# Patient Record
Sex: Male | Born: 1935 | Race: White | Hispanic: No | Marital: Married | Smoking: Former smoker
Health system: Southern US, Community
[De-identification: ages and names within clinical notes are randomized; demographics above are authoritative.]

## PROBLEM LIST (undated history)

## (undated) DIAGNOSIS — J8410 Pulmonary fibrosis, unspecified: Secondary | ICD-10-CM

## (undated) DIAGNOSIS — E785 Hyperlipidemia, unspecified: Secondary | ICD-10-CM

## (undated) DIAGNOSIS — I10 Essential (primary) hypertension: Secondary | ICD-10-CM

## (undated) DIAGNOSIS — I2510 Atherosclerotic heart disease of native coronary artery without angina pectoris: Secondary | ICD-10-CM

## (undated) DIAGNOSIS — I509 Heart failure, unspecified: Secondary | ICD-10-CM

## (undated) DIAGNOSIS — I4510 Unspecified right bundle-branch block: Secondary | ICD-10-CM

## (undated) DIAGNOSIS — I779 Disorder of arteries and arterioles, unspecified: Secondary | ICD-10-CM

## (undated) DIAGNOSIS — I340 Nonrheumatic mitral (valve) insufficiency: Secondary | ICD-10-CM

## (undated) DIAGNOSIS — I739 Peripheral vascular disease, unspecified: Secondary | ICD-10-CM

## (undated) HISTORY — DX: Atherosclerotic heart disease of native coronary artery without angina pectoris: I25.10

## (undated) HISTORY — DX: Essential (primary) hypertension: I10

## (undated) HISTORY — DX: Hyperlipidemia, unspecified: E78.5

## (undated) HISTORY — PX: OTHER SURGICAL HISTORY: SHX169

---

## 1991-03-17 HISTORY — PX: BACK SURGERY: SHX140

## 1997-11-22 ENCOUNTER — Other Ambulatory Visit: Admission: RE | Admit: 1997-11-22 | Discharge: 1997-11-22 | Payer: Self-pay | Admitting: Gastroenterology

## 1997-12-24 ENCOUNTER — Ambulatory Visit (HOSPITAL_COMMUNITY): Admission: RE | Admit: 1997-12-24 | Discharge: 1997-12-24 | Payer: Self-pay | Admitting: Gastroenterology

## 2003-06-15 HISTORY — PX: CORONARY ARTERY BYPASS GRAFT: SHX141

## 2003-07-07 ENCOUNTER — Inpatient Hospital Stay (HOSPITAL_COMMUNITY): Admission: EM | Admit: 2003-07-07 | Discharge: 2003-07-18 | Payer: Self-pay | Admitting: Emergency Medicine

## 2003-08-10 ENCOUNTER — Encounter: Admission: RE | Admit: 2003-08-10 | Discharge: 2003-08-10 | Payer: Self-pay | Admitting: Cardiothoracic Surgery

## 2003-08-24 ENCOUNTER — Encounter: Admission: RE | Admit: 2003-08-24 | Discharge: 2003-08-24 | Payer: Self-pay | Admitting: Cardiothoracic Surgery

## 2004-08-12 IMAGING — CR DG CHEST 2V
2 series · 2 of 2 positions shown · non-contrast
Comparison: 08/10/03.

CLINICAL DATA: Post mitral valve replacement for mitral regurgitation.
 TWO VIEW CHEST

[view not recorded (1 of 2)]
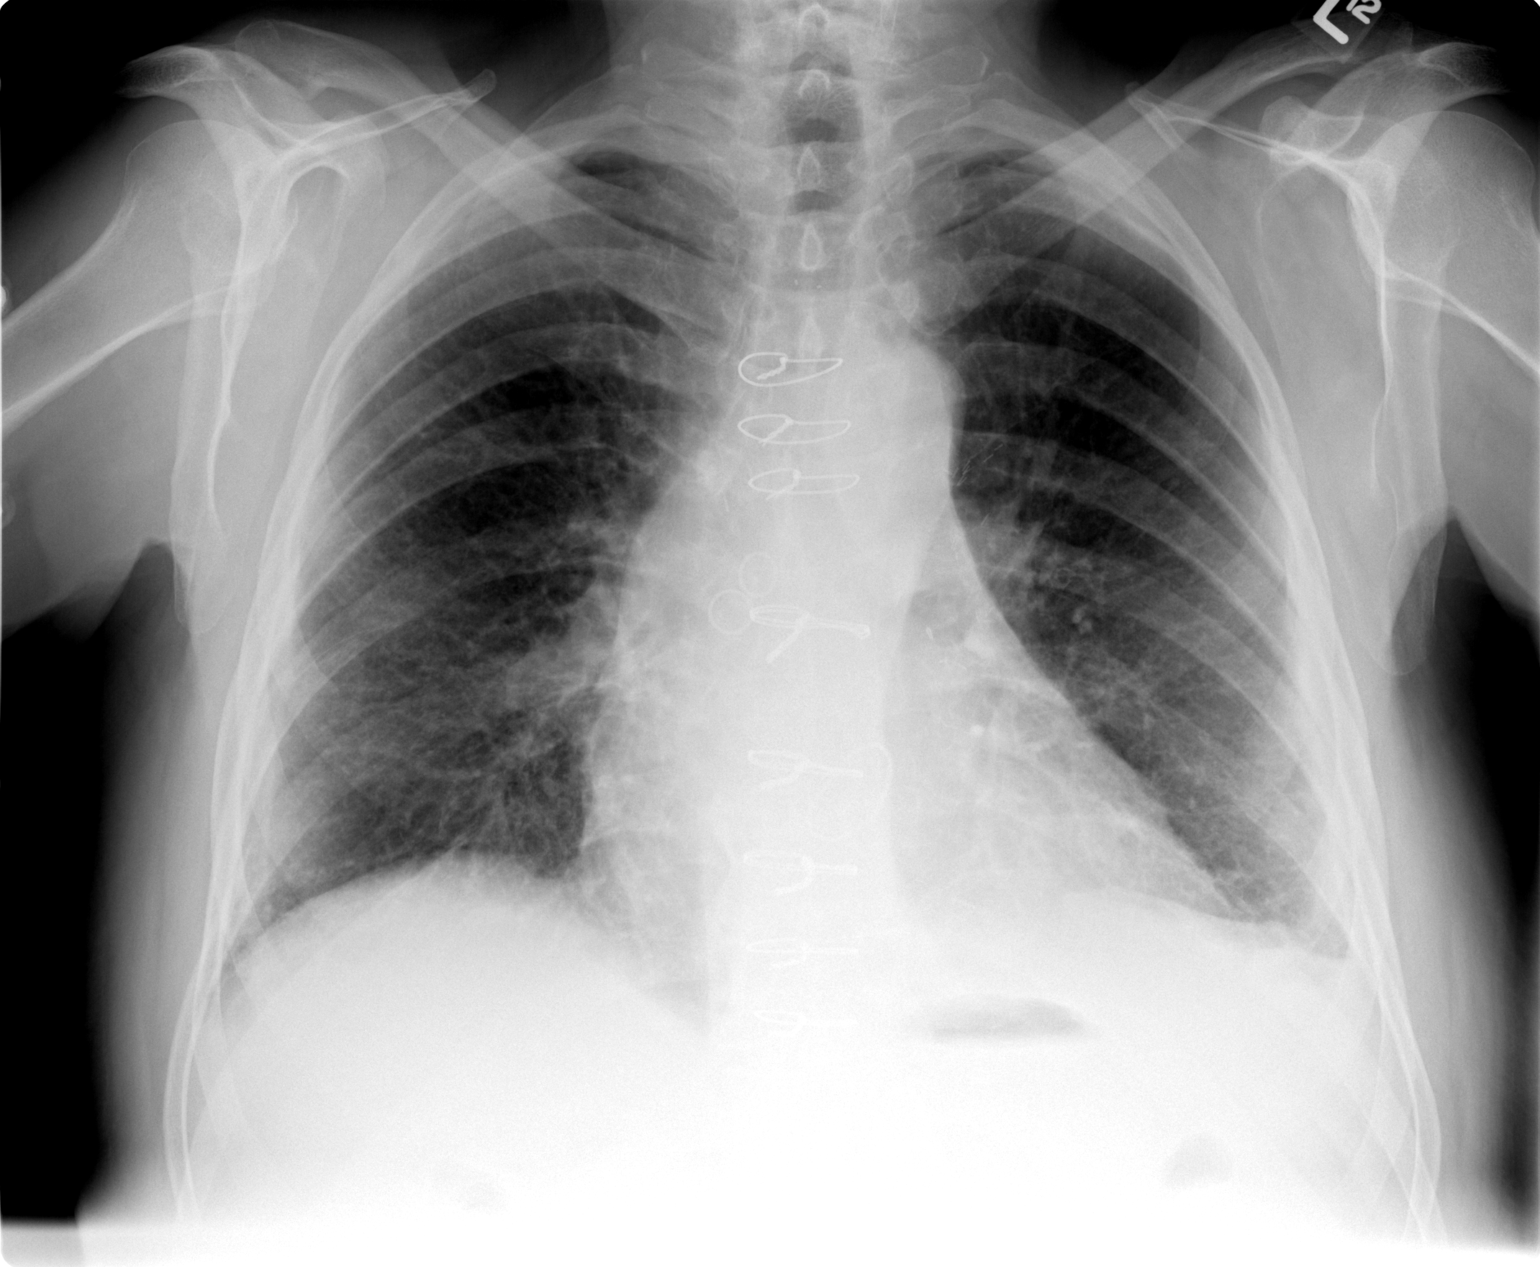

[view not recorded (2 of 2)]
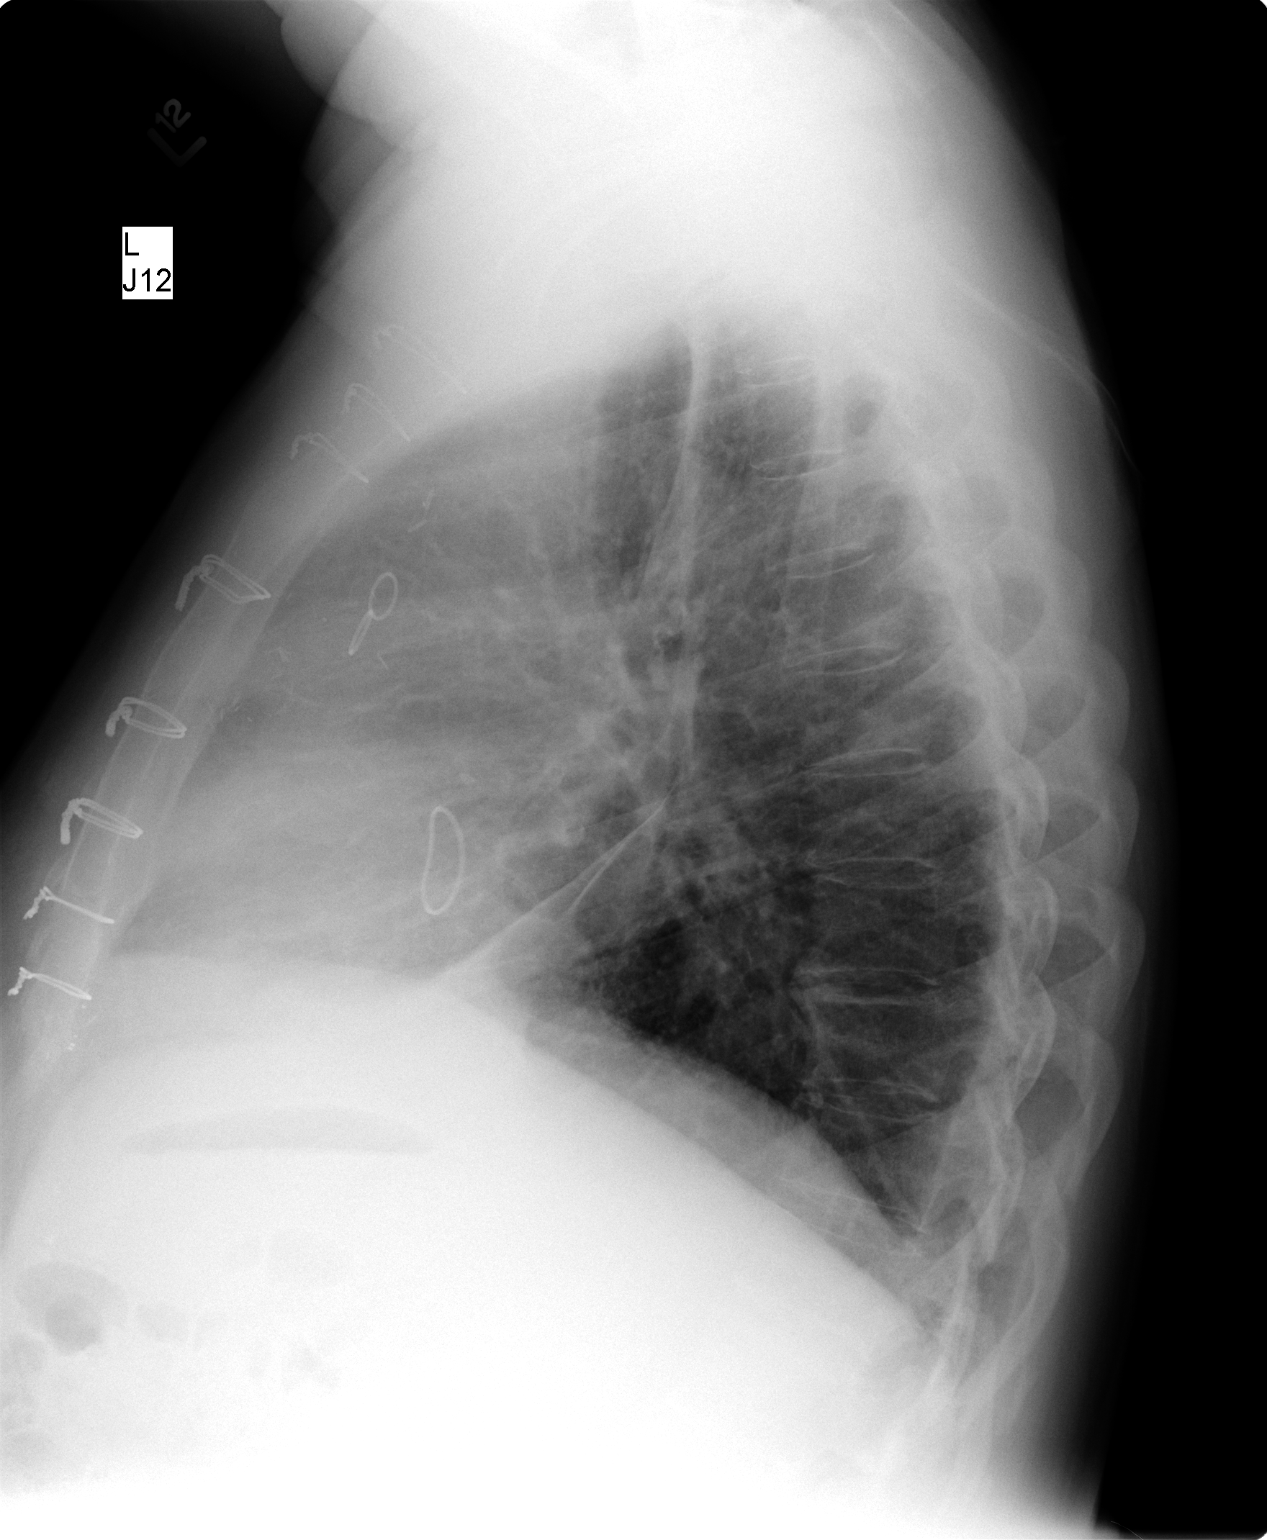

[2 of 2 positions shown; findings below may reference images not displayed]

Improved aeration at the lung bases.  Currently no significant residual fluid.  There is minimal residual left lower lobe atelectasis.  The heart size is upper normal.  No failure.
 IMPRESSION
 Improved aeration of the bases postoperatively.  Minimal residual left lower lobe atelectasis.

## 2005-10-21 ENCOUNTER — Inpatient Hospital Stay (HOSPITAL_COMMUNITY): Admission: EM | Admit: 2005-10-21 | Discharge: 2005-10-26 | Payer: Self-pay | Admitting: Emergency Medicine

## 2005-10-21 ENCOUNTER — Ambulatory Visit: Payer: Self-pay | Admitting: Critical Care Medicine

## 2007-03-17 HISTORY — PX: CARDIAC CATHETERIZATION: SHX172

## 2007-11-22 ENCOUNTER — Encounter: Payer: Self-pay | Admitting: Cardiovascular Disease

## 2007-11-22 LAB — CONVERTED CEMR LAB
CO2: 21 meq/L
Calcium: 9.2 mg/dL
Glucose, Bld: 78 mg/dL
Sodium: 138 meq/L

## 2007-11-28 ENCOUNTER — Encounter: Payer: Self-pay | Admitting: Cardiovascular Disease

## 2007-11-28 ENCOUNTER — Inpatient Hospital Stay (HOSPITAL_COMMUNITY): Admission: RE | Admit: 2007-11-28 | Discharge: 2007-11-29 | Payer: Self-pay | Admitting: Cardiovascular Disease

## 2007-12-20 ENCOUNTER — Ambulatory Visit: Payer: Self-pay | Admitting: Vascular Surgery

## 2007-12-29 ENCOUNTER — Encounter: Payer: Self-pay | Admitting: Vascular Surgery

## 2007-12-29 ENCOUNTER — Encounter: Payer: Self-pay | Admitting: Cardiovascular Disease

## 2007-12-29 ENCOUNTER — Ambulatory Visit: Payer: Self-pay | Admitting: Vascular Surgery

## 2007-12-29 ENCOUNTER — Inpatient Hospital Stay (HOSPITAL_COMMUNITY): Admission: RE | Admit: 2007-12-29 | Discharge: 2007-12-30 | Payer: Self-pay | Admitting: Vascular Surgery

## 2008-01-17 ENCOUNTER — Ambulatory Visit: Payer: Self-pay | Admitting: Vascular Surgery

## 2008-07-24 ENCOUNTER — Ambulatory Visit: Payer: Self-pay | Admitting: Vascular Surgery

## 2008-07-24 ENCOUNTER — Encounter: Payer: Self-pay | Admitting: Cardiovascular Disease

## 2008-09-27 ENCOUNTER — Encounter: Payer: Self-pay | Admitting: Cardiovascular Disease

## 2008-12-16 IMAGING — CR DG CHEST 2V
2 series · 2 of 2 positions shown · non-contrast
Comparison: 10/25/2005

CLINICAL DATA: Preadmission

CHEST - 2 VIEW

[view not recorded (1 of 2)]
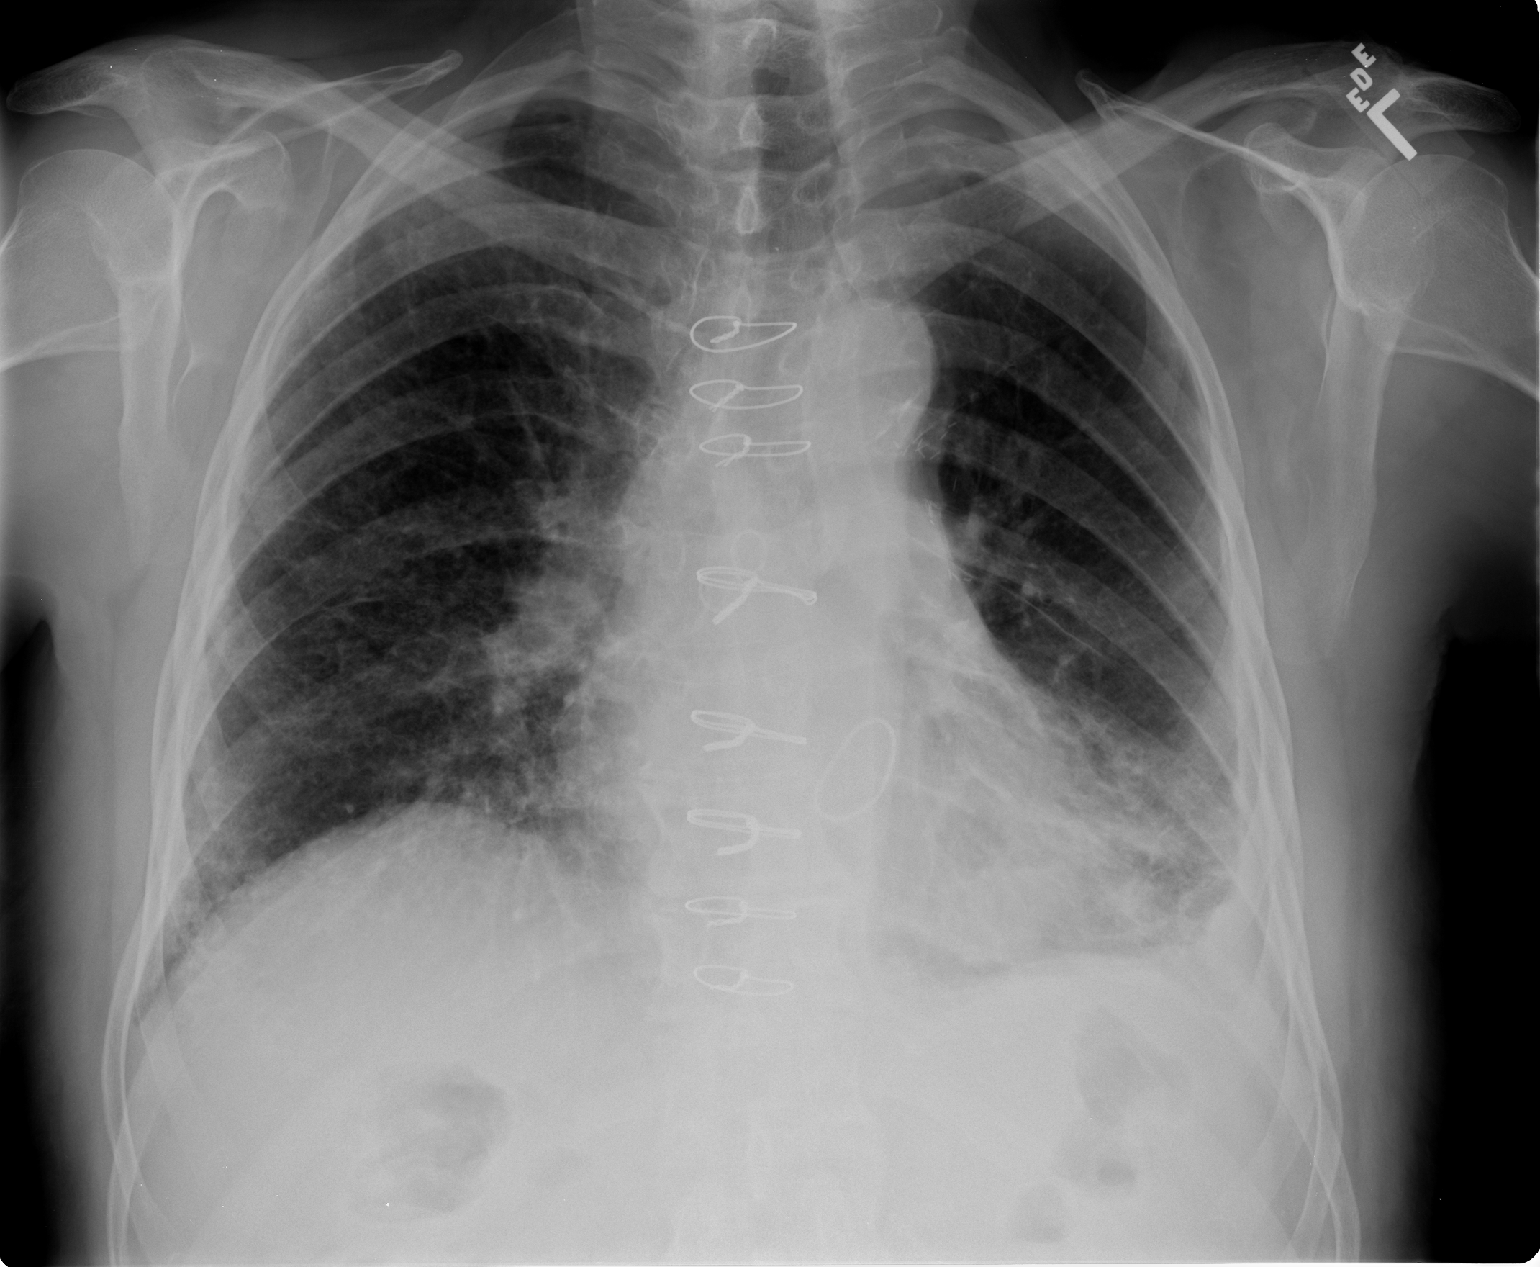

[view not recorded (2 of 2)]
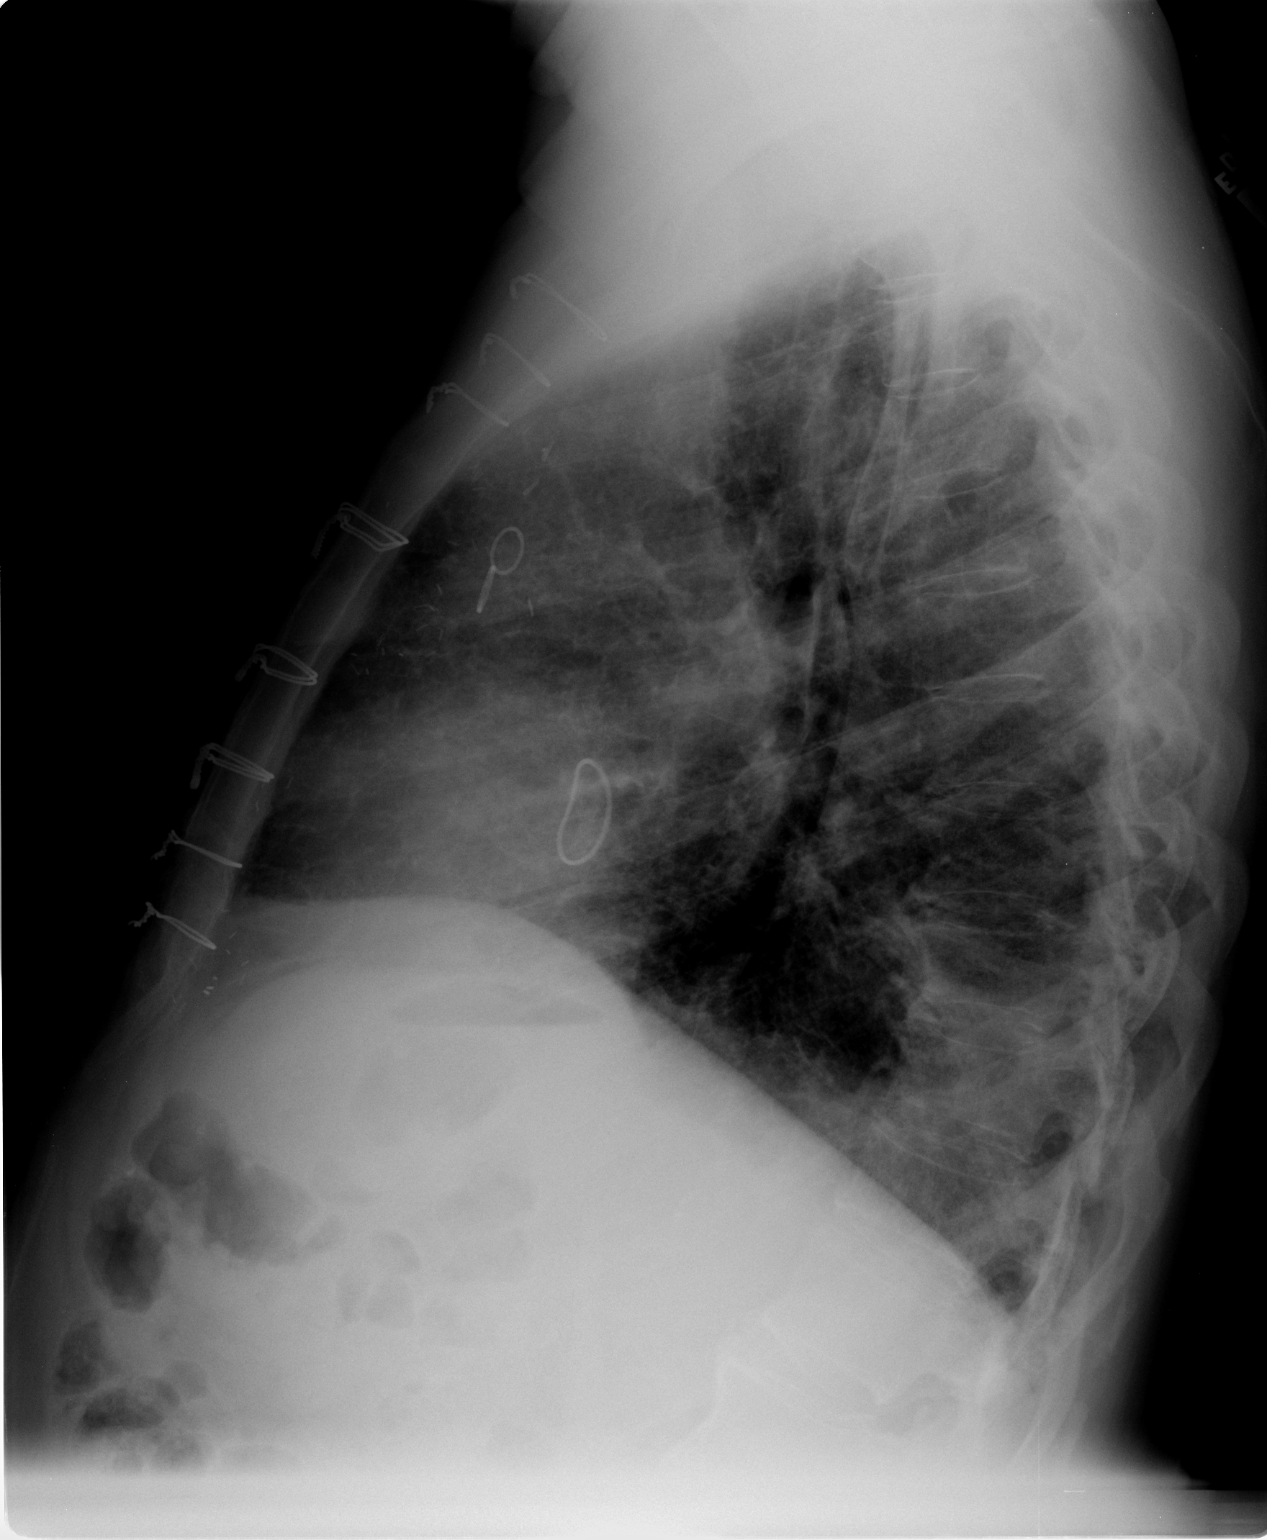

[2 of 2 positions shown; findings below may reference images not displayed]

FINDINGS: Cardiomediastinal silhouette is stable.  Again noted
status post median sternotomy, CABG and cardiac valve replacement.
There is a small left pleural effusion with left basilar
atelectasis, scarring or infiltrate.  Chronic mild interstitial
prominence.  Mild degenerative changes thoracic spine.
IMPRESSION: Again noted chronic interstitial prominence and status post CABG.
There is small left pleural effusion with left basilar atelectasis,
scarring or infiltrate.  Degenerative changes thoracic spine.

## 2009-03-20 ENCOUNTER — Encounter: Payer: Self-pay | Admitting: Cardiovascular Disease

## 2009-03-22 ENCOUNTER — Encounter: Payer: Self-pay | Admitting: Cardiovascular Disease

## 2009-03-22 LAB — CONVERTED CEMR LAB
ALT: 6 units/L
Alkaline Phosphatase: 58 units/L
BUN: 31 mg/dL
CO2: 24 meq/L
Cholesterol: 148 mg/dL
Creatinine, Ser: 1.48 mg/dL
LDL Cholesterol: 70 mg/dL
Total Bilirubin: 0.4 mg/dL
Total Protein: 6.8 g/dL
Triglyceride fasting, serum: 91 mg/dL

## 2009-03-27 ENCOUNTER — Encounter: Payer: Self-pay | Admitting: Cardiovascular Disease

## 2009-04-10 ENCOUNTER — Encounter: Payer: Self-pay | Admitting: Cardiovascular Disease

## 2009-04-16 ENCOUNTER — Encounter: Payer: Self-pay | Admitting: Cardiovascular Disease

## 2009-04-22 ENCOUNTER — Encounter: Payer: Self-pay | Admitting: Cardiovascular Disease

## 2009-10-11 ENCOUNTER — Encounter: Payer: Self-pay | Admitting: Cardiovascular Disease

## 2009-10-21 ENCOUNTER — Ambulatory Visit: Payer: Self-pay | Admitting: Cardiovascular Disease

## 2009-10-21 DIAGNOSIS — E785 Hyperlipidemia, unspecified: Secondary | ICD-10-CM

## 2009-10-21 DIAGNOSIS — I2510 Atherosclerotic heart disease of native coronary artery without angina pectoris: Secondary | ICD-10-CM | POA: Insufficient documentation

## 2009-10-21 DIAGNOSIS — I6529 Occlusion and stenosis of unspecified carotid artery: Secondary | ICD-10-CM

## 2010-02-11 ENCOUNTER — Ambulatory Visit: Payer: Self-pay

## 2010-02-11 ENCOUNTER — Encounter: Payer: Self-pay | Admitting: Cardiovascular Disease

## 2010-04-04 ENCOUNTER — Other Ambulatory Visit: Payer: Medicare Other | Admitting: Family Medicine

## 2010-04-07 ENCOUNTER — Ambulatory Visit: Payer: Medicare Other | Admitting: Ophthalmology

## 2010-04-13 LAB — CONVERTED CEMR LAB
BUN: 38 mg/dL
Calcium: 9.3 mg/dL
Creatinine, Ser: 1.52 mg/dL

## 2010-04-17 NOTE — Miscellaneous (Signed)
  Clinical Lists Changes  Observations: Added new observation of EGFR NOT AFA: 44 mL/min/1.59m2 (04/10/2009 16:44) Added new observation of EGFR IF AFA: 54 mL/min/1.50m2 (04/10/2009 16:44) Added new observation of CALCIUM: 9.3 mg/dL (14/78/2956 21:30) Added new observation of CREATININE: 1.52 mg/dL (86/57/8469 62:95) Added new observation of BUN: 38 mg/dL (28/41/3244 01:02) Added new observation of CO2 PLSM/SER: 20 meq/L (04/10/2009 16:44) Added new observation of CL SERUM: 106 meq/L (04/10/2009 16:44) Added new observation of K SERUM: 5.3 meq/L (04/10/2009 16:44) Added new observation of NA: 141 meq/L (04/10/2009 16:44) Added new observation of BG RANDOM: 107 mg/dL (72/53/6644 03:47)

## 2010-04-17 NOTE — Letter (Signed)
Summary: Medical Record Release  Medical Record Release   Imported By: Harlon Flor 05/16/2009 08:36:16  _____________________________________________________________________  External Attachment:    Type:   Image     Comment:   External Document

## 2010-04-17 NOTE — Op Note (Signed)
Summary: Carotid Endarterectomy  Carotid Endarterectomy   Imported By: Harlon Flor 10/23/2009 10:17:09  _____________________________________________________________________  External Attachment:    Type:   Image     Comment:   External Document

## 2010-04-17 NOTE — Assessment & Plan Note (Signed)
Summary: F/U 6 month   Visit Type:  Follow-up   History of Present Illness: 77 year old male with a history of coronary artery disease, bypass surgery in 1995, peripheral vascular disease with right carotid endarterectomy for 80% lesion, PCI of his left circumflex in 2009 and PTCA of an OM1 at the same time, history of hypertension, hyperlipidemia who presents to establish care.  Overall he states that he has been doing well. His daughter has been very ill with brain cancer and he has been traveling to [facility] on a frequent basis. He does not exercise very much. He does have chronic mild shortness of breath. No chest pain with exertion. He denies any other symptoms and is otherwise feeling well.   Cardiac cath November 28 2007 showed that the LIMA to the LAD was patent.  The saphenous vein graft to the diagonal was patent.  The right coronary artery was nondominant.  The circumflex had a 90% stenosis of the takeoff of OM1.  The takeoff of the OM2 had a subtotal occlusion. The jump graft between the OM2 and distal circumflex were patent.  He underwent successful PTCA and stenting of the circumflex and a PTCA of the OM1.   EKG today shows normal sinus rhythm with rate 70 beats per minute, nonspecific ST changes in leads V5, V6, leads one, aVL  Preventive Screening-Counseling & Management  Alcohol-Tobacco     Smoking Status: quit      Drug Use:  no.    Current Medications (verified): 1)  Simvastatin 40 Mg Tabs (Simvastatin) .... Take One Tablet By Mouth Daily At Bedtime 2)  Carvedilol 25 Mg Tabs (Carvedilol) .... Take One Tablet By Mouth Twice A Day 3)  Aspirin 81 Mg Tbec (Aspirin) .... Take One Tablet By Mouth Daily 4)  Prilosec 20 Mg Cpdr (Omeprazole) .Marland Kitchen.. 1 Tab By Mouth Once Daily 5)  Amlodipine Besylate 10 Mg Tabs (Amlodipine Besylate) .Marland Kitchen.. 1 Tablet Once Daily  Allergies (verified): 1)  ! Codeine  Past History:  Past Surgical History: Back Surgery, 1993 CABG,   06-2003 STENTS, 2009  Family History: Family History of Hyperlipidemia:  Family History of Thyroid Disease:   Social History: Retired  Married  Tobacco Use - Former.  Alcohol Use - no Drug Use - no Smoking Status:  quit Drug Use:  no  Review of Systems  The patient denies fever, weight loss, weight gain, vision loss, decreased hearing, hoarseness, chest pain, syncope, dyspnea on exertion, peripheral edema, prolonged cough, abdominal pain, incontinence, muscle weakness, depression, and enlarged lymph nodes.    Vital Signs:  Patient profile:   77 year old male Height:      74 inches Weight:      206.8 pounds BMI:     26.65 Pulse rate:   70 / minute BP sitting:   148 / 70  (left arm) Cuff size:   regular  Vitals Entered By: Caralee Ates CMA (October 21, 2009 11:19 AM)  Physical Exam  General:  Well developed, well nourished, in no acute distress. Head:  normocephalic and atraumatic Neck:  Neck supple, no JVD. No masses, thyromegaly or abnormal cervical nodes. Lungs:  Clear bilaterally to auscultation and percussion. Heart:  Non-displaced PMI, chest non-tender; regular rate and rhythm, S1, S2 without murmurs, rubs or gallops. Carotid upstroke normal, no bruit.  Pedals normal pulses. No edema, no varicosities. Abdomen:  Bowel sounds positive; abdomen soft and non-tender without masses Msk:  Back normal, normal gait. Muscle strength and tone normal. Pulses:  pulses normal in all 4 extremities Extremities:  No clubbing or cyanosis. Neurologic:  Alert and oriented x 3. Skin:  Intact without lesions or rashes. Psych:  Normal affect.   Impression & Recommendations:  Problem # 1:  CAD, UNSPECIFIED SITE (ICD-414.00) history of CAD with cardiac catheterization in 2009. Patent grafts noted.  No symptoms of angina at this time though he is not very active. We have encouraged him to increase his activity/exercise.  The following medications were removed from the medication list:     Plavix 75 Mg Tabs (Clopidogrel bisulfate) .Marland Kitchen... Take one tablet by mouth daily His updated medication list for this problem includes:    Carvedilol 25 Mg Tabs (Carvedilol) .Marland Kitchen... Take one tablet by mouth twice a day    Aspirin 81 Mg Tbec (Aspirin) .Marland Kitchen... Take 2  tablets by mouth daily    Amlodipine Besylate 10 Mg Tabs (Amlodipine besylate) .Marland Kitchen... 1 tablet once daily  Orders: EKG w/ Interpretation (93000)  Problem # 2:  HYPERLIPIDEMIA-MIXED (ICD-272.4) Last lipid check in January of this year showed that he was at goal on his simvastatin. No changes made at this time though he is about b.i.d. we'll dose of simvastatin given he is on amlodipine. We will discuss this with him at his next visit.  His updated medication list for this problem includes:    Simvastatin 40 Mg Tabs (Simvastatin) .Marland Kitchen... Take one tablet by mouth daily at bedtime  Problem # 3:  CAROTID ARTERY STENOSIS, WITHOUT INFARCTION (ICD-433.10) History of carotid endarterectomy on the right. We will recheck a carotid duplex ultrasound at the end of this year to ensure it is patent.  The following medications were removed from the medication list:    Plavix 75 Mg Tabs (Clopidogrel bisulfate) .Marland Kitchen... Take one tablet by mouth daily His updated medication list for this problem includes:    Aspirin 81 Mg Tbec (Aspirin) .Marland Kitchen... Take 2  tablets by mouth daily  Other Orders: Carotid Duplex (Carotid Duplex)  Patient Instructions: 1)  Your physician recommends that you return for a FASTING lipid profile: Jan 2012 (lip/lft) 2)  Your physician wants you to follow-up in:   6 months You will receive a reminder letter in the mail two months in advance. If you don't receive a letter, please call our office to schedule the follow-up appointment. 3)  Your physician has requested that you have a carotid duplex. This test is an ultrasound of the carotid arteries in your neck. It looks at blood flow through these arteries that supply the brain with blood.  Allow one hour for this exam. There are no restrictions or special instructions. Nov 2011 4)  Your physician has recommended you make the following change in your medication: Increase asa 81 mg to 2 tabs

## 2010-04-17 NOTE — Progress Notes (Signed)
Summary: [facility]   Imported By: Harlon Flor 04/30/2009 10:35:13  _____________________________________________________________________  External Attachment:    Type:   Image     Comment:   External Document

## 2010-04-17 NOTE — Miscellaneous (Signed)
Summary: refill amlodipine besylate  Clinical Lists Changes  Medications: Added new medication of AMLODIPINE BESYLATE 10 MG TABS (AMLODIPINE BESYLATE) 1 tablet once daily - Signed Rx of AMLODIPINE BESYLATE 10 MG TABS (AMLODIPINE BESYLATE) 1 tablet once daily;  #30 x 6;  Signed;  Entered by: Bishop Dublin, CMA;  Authorized by: Dossie Arbour MD;  Method used: Electronically to [facility]. [phone]*, [address] [place], [place], [place]  [address], Ph: [phone] or [phone], Fax: [phone]    Prescriptions: AMLODIPINE BESYLATE 10 MG TABS (AMLODIPINE BESYLATE) 1 tablet once daily  #30 x 6   Entered by:   Bishop Dublin, CMA   Authorized by:   Dossie Arbour MD   Signed by:   Bishop Dublin, CMA on 10/11/2009   Method used:   Electronically to        [facility] [phone]* (retail)       [address] [place], [place]  [address]       Ph: [phone] or [phone]       Fax: [phone]   RxID:   [id number]

## 2010-06-05 ENCOUNTER — Other Ambulatory Visit: Payer: Self-pay | Admitting: Emergency Medicine

## 2010-06-05 MED ORDER — SIMVASTATIN 40 MG PO TABS: 40.0000 mg | ORAL_TABLET | Freq: Every evening | ORAL | Status: DC

## 2010-06-05 NOTE — Telephone Encounter (Signed)
rx sent into pharmacy/sab 

## 2010-06-13 ENCOUNTER — Encounter: Payer: Self-pay | Admitting: Cardiovascular Disease

## 2010-06-13 ENCOUNTER — Ambulatory Visit (INDEPENDENT_AMBULATORY_CARE_PROVIDER_SITE_OTHER): Payer: Medicare Other | Admitting: Cardiovascular Disease

## 2010-06-13 DIAGNOSIS — R609 Edema, unspecified: Secondary | ICD-10-CM

## 2010-06-13 DIAGNOSIS — I6529 Occlusion and stenosis of unspecified carotid artery: Secondary | ICD-10-CM

## 2010-06-13 DIAGNOSIS — R0602 Shortness of breath: Secondary | ICD-10-CM

## 2010-06-13 DIAGNOSIS — I2510 Atherosclerotic heart disease of native coronary artery without angina pectoris: Secondary | ICD-10-CM

## 2010-06-13 DIAGNOSIS — E785 Hyperlipidemia, unspecified: Secondary | ICD-10-CM

## 2010-06-13 MED ORDER — FUROSEMIDE 20 MG PO TABS: 20.0000 mg | ORAL_TABLET | Freq: Every day | ORAL | Status: DC | PRN

## 2010-06-13 NOTE — Assessment & Plan Note (Signed)
Carotid endarterectomy on the right, 40-59% disease on the left. Repeat carotid ultrasound late this year 2012.

## 2010-06-13 NOTE — Progress Notes (Signed)
Addended by: Lanny Hurst on: 06/13/2010 03:42 PM   Modules accepted: Orders

## 2010-06-13 NOTE — Assessment & Plan Note (Signed)
Unable to exclude angina as a cause of his shortness of breath. We will start with echocardiogram first and follow up in several weeks time. If symptoms get worse, will likely need stress test or catheterization.

## 2010-06-13 NOTE — Assessment & Plan Note (Signed)
Etiology of his shortness of breath is uncertain. He does have a remote smoking history and could have mild COPD. He also has severe coronary artery disease, history of bypass and stenting. Unable to exclude ischemia at this time.  We ordered an echocardiogram. Will try low-dose Lasix. If he has no improvement, he may have to order a stress test or cardiac catheterization.

## 2010-06-13 NOTE — Assessment & Plan Note (Signed)
Continue aggressive lipid management. Goal LDL less than 70 

## 2010-06-13 NOTE — Patient Instructions (Addendum)
We have scheduled an echo to determine why you are short of breath. If your breathing gets worse in the next few weeks, call the office. We have set up a follow up in clinic after the echo

## 2010-06-13 NOTE — Progress Notes (Signed)
Patient ID: Cody [name], male    DOB: [birth year], 77 y.o.   MRN: [id number]  HPI Comments: 77 year old male with a history of coronary artery disease, bypass surgery in 1995, peripheral vascular disease with right carotid endarterectomy for 80% lesion, PCI of his left circumflex in 2009 and PTCA of an OM1 at the same time, history of hypertension, hyperlipidemia who presents 4 routine followup and for new symptoms of shortness of breath  He reports that over the past several weeks to months, he has had worsening shortness of breath with exertion. His wife has noticed this with minimal exertion. He denies any significant chest pain. He has been active around the house, does gardening. He also has had some swelling in his ankles and lower legs. He denies any cough. He is able to sleep well with no PND or orthopnea.   He reports that his daughter recently passed away from brain cancer and they've had the funeral this past week.   Cardiac cath November 28 2007 showed that the LIMA to the LAD was patent.  The saphenous vein graft to the diagonal was patent.  The right coronary artery was nondominant.  The circumflex had a 90% stenosis of the takeoff of OM1.  The takeoff of the OM2 had a subtotal occlusion. The jump graft between the OM2 and distal circumflex were patent.  He underwent successful PTCA and stenting of the circumflex and a PTCA of the OM1.    Last echocardiogram in 2008  Carotid ultrasound in November 2011 showing 40-59% carotid disease on the left   EKG today shows normal sinus rhythm with rate 70 beats per minute, nonspecific ST changes in leads V5, V6, leads one, aVL    Review of Systems  Constitutional: Negative.   HENT: Negative.   Eyes: Negative.   Respiratory: Positive for shortness of breath. Negative for cough, choking, chest tightness and wheezing.   Cardiovascular: Positive for leg swelling.  Gastrointestinal: Negative.   Musculoskeletal: Negative.   Skin:  Negative.   Neurological: Negative.   Hematological: Negative.   Psychiatric/Behavioral: Negative.   All other systems reviewed and are negative.   BP 120/58  Pulse 63  Ht 6\' 2"  (1.88 m)  Wt 207 lb (93.895 kg)  BMI 26.58 kg/m2   Physical Exam  Nursing note and vitals reviewed. Constitutional: He is oriented to person, place, and time. He appears well-developed and well-nourished.  HENT:  Head: Normocephalic.  Nose: Nose normal.  Mouth/Throat: Oropharynx is clear and moist.  Eyes: Conjunctivae are normal. Pupils are equal, round, and reactive to light.  Neck: Normal range of motion. Neck supple. No JVD present.  Cardiovascular: Normal rate, regular rhythm, S1 normal, S2 normal and intact distal pulses.  Exam reveals no gallop and no friction rub.   Murmur heard.  Systolic murmur is present with a grade of 2/6  Pulmonary/Chest: Effort normal. No respiratory distress. He has no wheezes. He has no rales. He exhibits no tenderness.       Scant rales at the bases.  Abdominal: Soft. Bowel sounds are normal. He exhibits no distension. There is no tenderness.  Musculoskeletal: Normal range of motion. He exhibits no edema and no tenderness.  Lymphadenopathy:    He has no cervical adenopathy.  Neurological: He is alert and oriented to person, place, and time. Coordination normal.  Skin: Skin is warm and dry. No rash noted. No erythema.  Psychiatric: He has a normal mood and affect. His behavior is normal. Judgment and  thought content normal.           Assessment and Plan

## 2010-06-13 NOTE — Assessment & Plan Note (Signed)
He does have trace edema in his bilateral lower extremities. This could be secondary to venous insufficiency as there is no pitting edema. He has been driving numerous hours in his car for repeated trips to [facility]. We have suggested he wear tight compression hose.   We'll also check an echocardiogram to exclude elevated right ventricular systolic pressures. We will prescribe Lasix 20 mg to be taken p.r.n. For worsening edema.

## 2010-06-16 NOTE — Progress Notes (Signed)
Addended by: Lanny Hurst on: 06/16/2010 10:01 AM   Modules accepted: Orders

## 2010-07-03 ENCOUNTER — Other Ambulatory Visit: Payer: Medicare Other | Admitting: *Deleted

## 2010-07-03 ENCOUNTER — Other Ambulatory Visit: Payer: Self-pay | Admitting: Cardiovascular Disease

## 2010-07-03 ENCOUNTER — Other Ambulatory Visit: Payer: Self-pay | Admitting: *Deleted

## 2010-07-03 ENCOUNTER — Ambulatory Visit (INDEPENDENT_AMBULATORY_CARE_PROVIDER_SITE_OTHER): Payer: Medicare Other | Admitting: *Deleted

## 2010-07-03 DIAGNOSIS — R600 Localized edema: Secondary | ICD-10-CM

## 2010-07-03 DIAGNOSIS — R609 Edema, unspecified: Secondary | ICD-10-CM

## 2010-07-03 DIAGNOSIS — R0602 Shortness of breath: Secondary | ICD-10-CM

## 2010-07-03 DIAGNOSIS — I2510 Atherosclerotic heart disease of native coronary artery without angina pectoris: Secondary | ICD-10-CM

## 2010-07-07 ENCOUNTER — Encounter: Payer: Self-pay | Admitting: Cardiovascular Disease

## 2010-07-07 ENCOUNTER — Ambulatory Visit (INDEPENDENT_AMBULATORY_CARE_PROVIDER_SITE_OTHER): Payer: Medicare Other | Admitting: Cardiovascular Disease

## 2010-07-07 DIAGNOSIS — R609 Edema, unspecified: Secondary | ICD-10-CM

## 2010-07-07 DIAGNOSIS — I6529 Occlusion and stenosis of unspecified carotid artery: Secondary | ICD-10-CM

## 2010-07-07 DIAGNOSIS — E785 Hyperlipidemia, unspecified: Secondary | ICD-10-CM

## 2010-07-07 DIAGNOSIS — I2510 Atherosclerotic heart disease of native coronary artery without angina pectoris: Secondary | ICD-10-CM

## 2010-07-07 DIAGNOSIS — R0602 Shortness of breath: Secondary | ICD-10-CM

## 2010-07-07 NOTE — Assessment & Plan Note (Signed)
Shortness of breath was likely secondary to pulmonary hypertension and fluid overload, diastolic dysfunction. I have asked him to decrease his salt intake and continue Lasix every other day.

## 2010-07-07 NOTE — Assessment & Plan Note (Signed)
He will need annual carotid ultrasound. Continue aggressive medical management.

## 2010-07-07 NOTE — Assessment & Plan Note (Signed)
Currently with no symptoms of angina. No further workup at this time. Continue current medication regimen. 

## 2010-07-07 NOTE — Assessment & Plan Note (Signed)
Edema has improved with diuretic. I have asked him to continue on his Lasix and take an extra dose as needed for edema.

## 2010-07-07 NOTE — Assessment & Plan Note (Signed)
Cholesterol is at goal on the current lipid regimen. No changes to the medications were made.  

## 2010-07-07 NOTE — Progress Notes (Signed)
Patient ID: Cody [name], male    DOB: 09-19-1926, 77 y.o.   MRN: [id number]  HPI Comments: 77 year old male with a history of coronary artery disease, bypass surgery in 1995, peripheral vascular disease with right carotid endarterectomy for 80% lesion, PCI of his left circumflex in 2009 and PTCA of an OM1 at the same time, history of hypertension, hyperlipidemia who presents for followup after his echo which was performed for symptoms of shortness of breath.  He was last seen in clinic at the end of March. We started Lasix at that time. Echocardiogram was done 2-1/2 weeks later. Echocardiogram showed normal systolic function with mildly elevated right ventricular systolic pressures.  Today he reports that his breathing is significantly better. He has been walking more than he has in several years time. His wife believes that the diuretic has significantly helped his symptoms. On further questioning, he salts all of his food even before he tastes it. He has been doing this for his whole life. Otherwise he feels well and has no complaints   He reports that his daughter recently passed away from brain cancer.   Cardiac cath November 28 2007 showed that the LIMA to the LAD was patent.  The saphenous vein graft to the diagonal was patent.  The right coronary artery was nondominant.  The circumflex had a 90% stenosis of the takeoff of OM1.  The takeoff of the OM2 had a subtotal occlusion. The jump graft between the OM2 and distal circumflex were patent.  He underwent successful PTCA and stenting of the circumflex and a PTCA of the OM1.    Carotid ultrasound in November 2011 showing 40-59% carotid disease on the left   Old EKG  shows normal sinus rhythm with rate 70 beats per minute, nonspecific ST changes in leads V5, V6, leads one, aVL      Review of Systems  Constitutional: Negative.   HENT: Negative.   Eyes: Negative.   Respiratory: Negative.   Cardiovascular: Negative.     Gastrointestinal: Negative.   Musculoskeletal: Negative.   Skin: Negative.   Neurological: Negative.   Hematological: Negative.   Psychiatric/Behavioral: Negative.   All other systems reviewed and are negative.   BP 118/72  Pulse 64  Ht 6\' 2"  (1.88 m)  Wt 208 lb 12.8 oz (94.711 kg)  BMI 26.81 kg/m2   Physical Exam  Nursing note and vitals reviewed. Constitutional: He is oriented to person, place, and time. He appears well-developed and well-nourished.  HENT:  Head: Normocephalic.  Nose: Nose normal.  Mouth/Throat: Oropharynx is clear and moist.  Eyes: Conjunctivae are normal. Pupils are equal, round, and reactive to light.  Neck: Normal range of motion. Neck supple. No JVD present.  Cardiovascular: Normal rate, regular rhythm, S1 normal, S2 normal, normal heart sounds and intact distal pulses.  Exam reveals no gallop and no friction rub.   No murmur heard. Pulmonary/Chest: Effort normal and breath sounds normal. No respiratory distress. He has no wheezes. He has no rales. He exhibits no tenderness.  Abdominal: Soft. Bowel sounds are normal. He exhibits no distension. There is no tenderness.  Musculoskeletal: Normal range of motion. He exhibits no edema and no tenderness.  Lymphadenopathy:    He has no cervical adenopathy.  Neurological: He is alert and oriented to person, place, and time. Coordination normal.  Skin: Skin is warm and dry. No rash noted. No erythema.  Psychiatric: He has a normal mood and affect. His behavior is normal. Judgment and thought content  normal.           Assessment and Plan

## 2010-07-07 NOTE — Patient Instructions (Addendum)
You are doing well. No medication changes were made. Take the lasix every other day and as needed for shortness of breath.  Please call us if you have new issues that need to be addressed before your next appt.  We will call you for a follow up Appt. In 6 months.

## 2010-07-29 NOTE — Assessment & Plan Note (Signed)
OFFICE VISIT   RANFERI, CLINGAN  DOB:  1927/01/05                                       01/17/2008  UJWJX#:[id number]   I saw the patient in the office today for followup after his recent  right carotid endarterectomy.  This is a 77 year old gentleman who by  duplex had a greater than 80% right carotid stenosis.  This was  confirmed by an arteriogram.  He did undergo cardiac catheterization and  PTCA with placement of a stent and was left on Plavix.  He was  discharged on postoperative day #1.  He returns for his first outpatient  visit.  He denies any history of focal weakness or paresthesias.   PHYSICAL EXAMINATION:  On physical examination blood pressure is 162/73,  heart rate is 78.  His neck incision on the left has healed nicely.  I  do not detect any carotid bruits.  Lungs are clear bilaterally to  auscultation.  On cardiac exam he has a regular rate and rhythm.  Abdomen is soft and nontender.  Neurologic exam is nonfocal.   Overall, I am pleased with his progress.  We will see him back in 6  months for a followup duplex scan.  He knows to call sooner if he has  problems.  In the meantime he will remain on his Plavix.   Di Kindle. Edilia Bo, M.D.  Electronically Signed   CSD/MEDQ  D:  01/17/2008  T:  01/19/2008  Job:  [id number]   cc:   Cristy Hilts. Jacinto Halim, MD

## 2010-07-29 NOTE — Procedures (Signed)
CAROTID DUPLEX EXAM   INDICATION:  Right carotid endarterectomy.   HISTORY:  Diabetes:  No.  Cardiac:  PTCA/stent.  Hypertension:  Yes.  Smoking:  Previous.  Previous Surgery:  Right carotid endarterectomy on December 29, 2007.  CV History:  Asymptomatic.  Amaurosis Fugax No, Paresthesias No, Hemiparesis No                                       RIGHT             LEFT  Brachial systolic pressure:         120               124  Brachial Doppler waveforms:         Normal            Normal  Vertebral direction of flow:        Antegrade         Antegrade  DUPLEX VELOCITIES (cm/sec)  CCA peak systolic                   81                119  ECA peak systolic                   101               117  ICA peak systolic                   78                101  ICA end diastolic                   23                25  PLAQUE MORPHOLOGY:                                    Heterogenous  PLAQUE AMOUNT:                      None              Mild  PLAQUE LOCATION:                                      ICA/bifurcation.   IMPRESSION:  1. Patent right carotid endarterectomy site with no evidence of a      right internal carotid artery stenosis.  2. 1-39% stenosis of the left internal carotid artery.       ___________________________________________  Di Kindle. Edilia Bo, M.D.   CH/MEDQ  D:  07/24/2008  T:  07/24/2008  Job:  [id number]

## 2010-07-29 NOTE — Discharge Summary (Signed)
Cody [name], Cody [name]             ACCOUNT NO.:  [id number]   MEDICAL RECORD NO.:  [id number]          PATIENT TYPE:  INP   LOCATION:  6532                         FACILITY:  [facility]   PHYSICIAN:  Cristy Hilts. Jacinto Halim, MD       DATE OF BIRTH:  [birth year]   DATE OF ADMISSION:  11/28/2007  DATE OF DISCHARGE:  11/29/2007                               DISCHARGE SUMMARY   DISCHARGE DIAGNOSES:  1. Coronary artery disease, coronary artery bypass grafting in 2005      with native circumflex driver stenting this admission secondary to      graft occlusion.  2. A 90% right internal carotid artery stenosis by angiogram, this      admission.  3. Treated dyslipidemia.  4. Treated hypertension.   HOSPITAL COURSE:  The patient is a 77 year old male who had bypass  surgery in 2005.  He has been having dyspnea on exertion and had a  Myoview as an outpatient which was abnormal.  [facility] of coronary disease with  occlusion of the vein graft to the OM1 and OM3.  He was admitted for  elective intervention.  He was also noted to have a right carotid  bruits.  Catheterization was done on November 28, 2007, by Dr. Jacinto Halim.  The LIMA to the LAD is patent.  SVG to the diagonal was patent, the RCA  was nondominant.  The circumflex had 90% stenosis of takeoff of the OM1.  The takeoff of the OM2 was subtotaled.  The jump graft between the OM2  and distal circumflex was patent.  The patient underwent PTCA and  stenting with driver stent to the circumflex and a PTCA to the OM1.  He  also had a peripheral angiogram which revealed a 90% right internal  carotid artery stenosis.  Dr. Jacinto Halim feels the patient will need a right  carotid endarterectomy and will discuss this with Dr. Edilia Bo.  He feels  the patient can be discharged on November 29, 2007.   LABORATORY DATA:  White count 9.5, hemoglobin 14.2, hematocrit 42.5,  platelets 170.  Sodium 139, potassium  3.9, BUN 23, creatinine 1.38.  His  initial troponin was slightly positive postprocedure at 0.08.  CK-MB has  been negative.   DISCHARGE MEDICATIONS:  1. Aspirin 325 mg a day.  2. Coreg 25 mg b.i.d.  3. Simvastatin 40 mg a day.  4. Diovan/hydrochlorothiazide 160/12.5 daily.  5. Plavix 75 mg a day.  6. Pepcid 20 mg a day.  7. Nitroglycerin sublingual p.r.n.   DISPOSITION:  The patient is discharged in stable condition and will  follow up with Dr. Jacinto Halim.      Abelino Derrick, P.A.      Cristy Hilts. Jacinto Halim, MD  Electronically Signed    LKK/MEDQ  D:  11/29/2007  T:  11/29/2007  Job:  [id number]   cc:   Di Kindle. Edilia Bo, M.D.

## 2010-07-29 NOTE — Discharge Summary (Signed)
NAMERUFUS, BESKE NO.:  [id number]   MEDICAL RECORD NO.:  [id number]          PATIENT TYPE:  INP   LOCATION:  3309                         FACILITY:  [facility]   PHYSICIAN:  Jerold Coombe, P.A.DATE OF BIRTH:  [birth year]   DATE OF ADMISSION:  12/29/2007  DATE OF DISCHARGE:                               DISCHARGE SUMMARY   ADMISSION DIAGNOSIS:  Asymptomatic 90% right internal carotid artery  stenosis.   DISCHARGE/SECONDARY DIAGNOSES:  1. Asymptomatic 90% right internal carotid artery stenosis, status      post right carotid endarterectomy.  2. Hypertension.  3. Hypercholesterolemia.  4. Coronary artery disease with myocardial infarction in 2005, status      post coronary artery bypass grafting x4 and mitral valve      annuloplasty for ischemic mitral regurgitation by Dr. Kathlee Nations      Trigt in April 2005.  5. Renal insufficiency with admission creatinine of around 1.5, and at      discharge around 1.3.  6. Remote history of tobacco use, quit in 1968.  7. Benign prostatic hypertrophy.   ALLERGIES:  No known drug allergies.   PROCEDURES:  On December 29, 2007, right carotid endarterectomy with  Dacron patch angioplasty with Dr. Waverly Ferrari.   BRIEF HISTORY:  Mr. Ging is a 78 year old Caucasian male referred  to Dr. Waverly Ferrari by Dr. Yates Decamp for 90% right internal  carotid artery stenosis.  There was no significant stenosis noted on the  left.  He underwent a cardiac catheterization, PTCA, and stenting of the  circumflex in OM1 and at that time had a cerebral arteriogram, which  demonstrated 90% right internal carotid artery stenosis.  He had been  asymptomatic of his carotid artery disease.  Dr. Edilia Bo recommended  right carotid endarterectomy to reduce his risk for future stroke.   HOSPITAL COURSE:  Mr. Kuster was electively admitted to [facility] on December 29, 2007.  He underwent the previously mentioned  procedure.  He was extubated and neurologically intact with short stay  in the recovery unit and was transferred to step down unit of 3300 where  he was anticipated to remain until discharge.  On postoperative day #1,  he remained stable, afebrile, and maintaining sinus rhythm.  Blood  pressure 128/63 and oxygen saturation of 95% on room air.  Overnight, he  had been hypertensive with NIBP reading 165/65.  However, one arterial  line reading showing 200/86.  He was also complaining of the headache  overnight, but had been treated successfully with pain medication.  By  morning, this had resolved.  His antihypertensive medications were  resumed on postoperative day 1.   His labs were stable showing a white count of 12.2, hemoglobin 14.3,  hematocrit 42.8, platelet count 163.  Potassium 4.1, sodium 134, BUN of  22, and creatinine of 1.36.  Again, his admission creatinine was 1.56.  Blood glucose was 115.  Neurologically, he remained intact. He denied  dysphagia.  Neck incision showed no signs of hematoma.  He is able to  ambulate and tolerate food.  At the time of his  dictation, his Foley  catheter has been discontinued.  If he is able to void without  difficulty, we will anticipate he will be discharged home on  postoperative day 1, December 30, 2007.  Currently, he remains in stable  condition.   DISCHARGE MEDICATIONS:  1. Plavix 75 mg p.o. daily.  2. Aspirin 81 mg q.p.m.  3. Diovan/hydrochlorothiazide 160/12.5 q.p.m.  4. Simvastatin 40 mg q.p.m.  5. Carvedilol 25 mg 1 p.o. b.i.d.  6. Tylox 1-2 tablets p.o. q.4 h. p.r.n. pain.   DISCHARGE INSTRUCTIONS:  Continue a heart healthy diet.  May shower and  clean incision gently with soap and water, avoid driving or heavy  lifting for the next couple of weeks.  Call if he has fevers greater  than 101 or redness or drainage from his incision sites, severe headache  or neurological changes.  Otherwise, he will see Dr. Edilia Bo in 2-3  weeks  in our office.  Our office will contact him in regards to specific  appointment date and time.      Jerold Coombe, P.A.     AWZ/MEDQ  D:  12/30/2007  T:  12/30/2007  Job:  [id number]   cc:   Di Kindle. Edilia Bo, M.D.  Kerin Perna, M.D.  Cristy Hilts. Jacinto Halim, MD

## 2010-07-29 NOTE — Cardiovascular Report (Signed)
NAME:  NELVIN, TOMB             ACCOUNT NO.:  [id number]   MEDICAL RECORD NO.:  [id number]          PATIENT TYPE:  OIB   LOCATION:  6532                         FACILITY:  [facility]   PHYSICIAN:  Cristy Hilts. Jacinto Halim, MD       DATE OF BIRTH:  11-17-1926   DATE OF PROCEDURE:  11/28/2007  DATE OF DISCHARGE:                            CARDIAC CATHETERIZATION   Assist: Dr. Nanetta Batty.   PROCEDURES PERFORMED:  1. Arch aortogram.  2. Four-vessel cerebral arteriogram including intracerebral      arteriography.  3. Percutaneous transluminal coronary angioplasty and AngioScore      atherotomy, left posterior descending artery.  4. Percutaneous transluminal coronary angioplasty and stenting of the      left proximal and mid circumflex coronary artery.  5. Percutaneous transluminal coronary angioplasty and balloon      angioplasty of the stent jailed first obtuse marginal branch of the      circumflex coronary artery.   INDICATIONS:  Ms. Huan Pollok is a 77 year old gentleman with known  coronary artery disease who had presented with acute inferior and  posterior wall myocardial infarction in 2004, associated with  cardiogenic shock.  He had undergone coronary artery bypass graft.  He  had presented in 2005, with unstable angina and he underwent cardiac  catheterization revealing a triple-vessel coronary artery disease and  had undergone coronary bypass grafting.  He has been complaining of  increasing angina pectoris and recent stress Myoview had revealed  worsening ischemia in the inferolateral wall with scar, with  superimposed ischemia.  Because of the symptoms of chest discomfort and  abnormal stress disease, he underwent cardiac catheterization in  [facility] on October 20, 2007.  At that time, he was found  high-grade stenosis of the proximal circumflex and also PDA branch  bifurcation, and the saphenous vein graft chip graft to OM-2 and PDA had  occluded in the proximal  segment.  However, the skip segment was patent.  He was now brought to the cardiac catheterization lab for possible  angioplasty of the left PDA and also left proximal circumflex coronary  artery.   Arch aortogram and cerebral angiography was performed because of  asymptomatic high-grade 90% stenosis, greater than 90% stenosis of the  right internal carotid artery.   CEREBRAL ARTERIOGRAPHY:  Arch aortogram.  The arch aortogram performed  in the LAO projection revealed the arch to be type 2 to 2-1/2.  It was  Bovine arch with the origin of the left internal carotid artery from the  right innominate artery.   RIGHT CAROTID ARTERIAL SYSTEM:  Right common carotid artery was widely  patent.  The right internal carotid artery showed a high-grade 99%  stenosis just after the bifurcation of the external carotid artery.   Right subclavian artery and right vertebral artery.  Right subclavian  and right vertebral artery was widely patent.   LEFT COMMON CAROTID ARTERY:  Left common carotid artery showed at most a  40% stenosis of the internal carotid artery at its origin.  Otherwise,  the common carotid and the external carotid artery were widely patent.  LEFT VERTEBRAL ARTERY:  Left vertebral artery showed an ostial 80%  stenosis.   Intracerebral circulation.  Both the posterior circulation and also the  carotid circulation, both on the left and right were intact without any  filling defects or any obvious aneurysms.   Overall impression of the cerebral circulation includes:  1. High-grade right internal carotid artery 99% stenosis.  2. Left internal carotid artery has 40% stenosis.  3. Left vertebral artery has an ostial 80% stenosis; however, this is      asymptomatic.  Both right and left vertebral artery are equally      sized.   RECOMMENDATIONS:  Based on the anatomy, he will need evaluation for  carotid endarterectomy.  Right common carotid artery and internal  carotid artery is  tortuous.  I will discuss these findings with vascular  surgeon and make further recommendations.   Cardiac catheterization.   ANGIOGRAPHIC DATA:  Successful PTCA and Angioscoring of the left PDA  with a 2.5 x 10-mm angioscope balloon.  Multiple inflations were  performed anywhere from 8 to 10 atmospheric pressure with overall  reduction of stenosis from 90% to less than 10%.  Brisk flow was noted  without any haziness or dissection.   Successful PTCA and stenting of the proximal and mid segment of the left  dominant circumflex artery with implantation of a 4.0 x 15-mm driver  deployed at 14 atmospheric pressure.  Overall, the stenosis was reduced  from 80% to 0%.  There was first obtuse marginal branch, which was stent  jailed and had compromised about 90% stenosis was reduced to less than  20% with 2.0 x 10-mm sprinter balloon performed at around 3 atmospheric  pressure.  Brisk TIMI 3 flow was established.   RECOMMENDATIONS:  The patient will need Plavix at least for a period of  4 weeks.  I will refer him for vascular surgical consultation for  carotid endarterectomy and I will leave the timing of this entirely to  the surgeon for carotid endarterectomy, which has got asymptomatic high-  grade stenosis.   Relayed either continuous or aggressive risk modification.  Overall,  excellent angiographic and angioplasty results were obtained on the left  coronary arterial system.   A total of 275 mL of contrast was utilized for cerebral angiography and  cardiac intervention.  The patient tolerated the procedure.  No  immediate complication was noted.   TECHNIQUE OF PROCEDURE:  Under usual sterile precautions, using a 7-  French right femoral artery access a 6-French pigtail catheter was  advanced to the ascending aorta and an arch aortogram was performed in  the LAO projection.  The catheter was then pulled out of body over the  [facility] wire and a 6-French JR-4 diagnostic catheter was  utilized to  perform cerebral arteriography.  The selective cannulation of the right  common carotid, left common carotid, and left vertebral artery was  performed and angiography was performed.  Right vertebral artery was  subselectively visualized using cannulation of his right subclavian  artery.  The catheter was then pulled out of the body in the usual  fashion over a J-wire.   TECHNIQUE OF CARDIAC INTERVENTION:  The 7-French FL-4.5 guide was  utilized to engage left main coronary artery.  Using ATW guidewire and  heparin and Integrilin for anticoagulation, the wire was carefully  advanced into the distal dominant circumflex coronary artery and placed  in the PDA branch.  A 2.5 x 10-mm AngioSculpt balloon was utilized for  performing multiple scoring across the PDA lesion.  Having performed  this, I left the lesion alone and used the same cutting balloon at high  pressures into the proximal circumflex coronary artery followed by  stenting with a 4.0 x 15 mm driver at 14 atmospheric pressure for a  minute and then I went ahead and used the same guidewire and rescued the  side branch compromise first obtuse marginal and 2.0 x 10-mm sprinter  balloon was utilized and angioplasty was performed at low pressure with  excellent results.  The wires were withdrawn angiographically.  Guide  catheter pulled out of the body in the usual fashion.  Right femoral  arteriography was performed through the arterial access sheath and the  access closed with StarClose with excellent hemostasis.  During the  procedure, intracoronary nitroglycerine was also administered.      Cristy Hilts. Jacinto Halim, MD  Electronically Signed     JRG/MEDQ  D:  11/28/2007  T:  11/29/2007  Job:  [id number]   cc:   Dr. Conley Simmonds

## 2010-07-29 NOTE — Op Note (Signed)
NAMEDAMANTE, SPRAGG NO.:  [id number]   MEDICAL RECORD NO.:  [id number]          PATIENT TYPE:  INP   LOCATION:  3309                         FACILITY:  [facility]   PHYSICIAN:  Di Kindle. Edilia Bo, M.D.DATE OF BIRTH:  May 18, 1926   DATE OF PROCEDURE:  12/29/2007  DATE OF DISCHARGE:                               OPERATIVE REPORT   PREOPERATIVE DIAGNOSIS:  Asymptomatic 90% right carotid stenosis.   POSTOPERATIVE DIAGNOSIS:  Asymptomatic 90% right carotid stenosis.   PROCEDURE:  Right carotid endarterectomy with Dacron patch angioplasty.   SURGEON:  Di Kindle. Edilia Bo, MD   ASSISTANT:  Jerold Coombe, PA   ANESTHESIA:  General.   INDICATIONS:  This is a 77 year old gentleman who by duplex had a  greater than 90% right carotid stenosis.  He had symptomatic coronary  artery disease and underwent cardiac catheterization and PTCA and had a  stent placed.  He was therefore left on Plavix.  Cerebral arteriogram  confirmed a 90% right carotid stenosis.  Now recommended right carotid  endarterectomy in order to lower his risk of future stroke.   TECHNIQUE:  The patient was taken to the operating room and received a  general anesthetic.  Arterial line had been placed by anesthesia.  The  neck and upper chest were prepped and draped in the usual sterile  fashion on the right.  An incision was made along the anterior border of  the sternocleidomastoid and dissection carried down to the common  carotid artery which was dissected free and controlled with a Rumel  tourniquet.  The facial vein was divided between 2-0 silk ties.  The  internal carotid artery was controlled above the plaque.  The external  carotid artery was controlled.  The patient was heparinized.  Clamps  were then placed on the internal, then the external, and then the common  carotid artery.  A longitudinal arteriotomy was made in the common  carotid artery and this was extended through the plaque  into the  internal carotid artery above the plaque.  A #10 shunt was placed into  the internal carotid artery, back-bled and placed into the common  carotid artery and secured with Rumel tourniquet.  Flow was  reestablished to the shunt.  Endarterectomy plane was established  proximally and the plaque was sharply divided.  Eversion endarterectomy  was performed of the external carotid artery.  Distally, there was a  nice taper in the plaque and no tacking sutures were required.  The  artery was irrigated with copious amounts of heparin and dextran and all  loose debris was removed.  Dacron patch was then sewn using continuous 6-  0 Prolene suture.  Prior to completing the closure, the shunt was  removed.  The arteries were back-bled and flushed appropriately and  anastomosis completed.  Flow was reestablished first to the external  carotid artery and into the internal carotid artery.  The heparin was  partially reversed with protamine.  He was on Plavix with instructions  not to discontinue this and he was somewhat oozy.  It took some time to  obtain hemostasis but we  were able to obtain good hemostasis without  having to place a drain.  The wound was then closed with a deep layer of  3-0 Vicryl.  The platysma was  closed with running 3-0 Vicryl and the skin closed with a 4-0  subcuticular stitch.  Sterile dressing was applied.  The patient awoke  neurologically intact and was transferred to the recovery room in stable  condition.  All needle and sponge counts were correct.      Di Kindle. Edilia Bo, M.D.  Electronically Signed     CSD/MEDQ  D:  12/29/2007  T:  12/29/2007  Job:  [id number]   cc:   Cristy Hilts. Jacinto Halim, MD

## 2010-07-29 NOTE — Consult Note (Signed)
VASCULAR SURGERY CONSULTATION   Cody [name], Cody [name]  DOB:  06/30/1926                                       12/20/2007  ZOXWR#:[id number]   I saw the patient in the today in consultation concerning a 90% right  internal carotid artery stenosis.  He was referred by Dr. Jacinto [name].  This  is a pleasant 77 year old right-handed gentleman who had undergone a  routine carotid duplex scan at [facility] and this  demonstrated a greater than 80% right carotid stenosis.  He had no  significant stenosis on the left.  He subsequently underwent cardiac  catheterization and PTCA and stenting to the circumflex and PTCA of the  OM1.  In addition he had a cerebral arteriogram which demonstrated a 90%  right internal carotid artery stenosis and he was sent for vascular  consultation.  Of note, he denies any history of stroke, TIAs,  expressive or receptive aphasia or amaurosis fugax.   PAST MEDICAL HISTORY:  Significant for hypertension,  hypercholesterolemia and a previous myocardial infarction in 2005.  He  underwent coronary revascularization by Dr. Donata [name] in 2005 with also  a valve repair although he does not remember the details.  He denies  any history of congestive heart failure or history of COPD.   FAMILY HISTORY:  There is no history of premature cardiovascular  disease.   SOCIAL HISTORY:  He is married.  He has three children.  He quit tobacco  in 1968.  He does not use alcohol on a regular basis.   ALLERGIES:  No known drug allergies.   MEDICATIONS:  1. Plavix 75 mg p.o. daily.  2. Enteric coated aspirin 325 mg p.o. daily.  3. Diovan/HCT 160/12.5 mg p.o. daily.  4. Simvastatin 40 mg p.o. daily.  5. Carvedilol 25 mg p.o. t.i.[name].   REVIEW OF SYSTEMS:  GENERAL:  He has had no recent weight loss, weight  gain or problems with appetite.  He is 200 pounds.  CARDIAC:  He has had no chest pain, chest pressure, palpitations,  arrhythmias or  orthopnea.  PULMONARY:  He has had no recent productive cough, bronchitis, asthma or  wheezing.  GI:  He has had no recent change in his bowel habits and has no history  of peptic ulcer disease.  GU:  He has had no dysuria or frequency.  VASCULAR:  He has had no claudication, rest pain or nonhealing ulcers.  He has had no history of DVT or phlebitis.  NEURO:  He has had no dizziness, blackouts, headaches or seizures.  ORTHO:  He has had no arthritis, joint pain, muscle pain or rash.  ENT:  He has had no recent change in his eyesight or change in his  hearing.  HEMATOLOGIC:  He has had no bleeding problems or clotting disorders.   PHYSICAL EXAMINATION:  General:  This is a pleasant 77 year old  gentleman who appears his stated age.  Vital signs:  His blood pressure  is 135/71, heart rate is 82.  Neck:  Is supple.  There is no cervical  lymphadenopathy.  He has a right carotid bruit.  Lungs:  Are clear  bilaterally to auscultation.  Cardiac:  He has a regular rate and  rhythm.  Abdomen:  Soft and nontender.  I cannot palpate an aneurysm.  He has normal pitched bowel sounds.  He  has palpable femoral, popliteal,  dorsalis pedis and posterior tibial pulses bilaterally.  He has no  significant lower extremity swelling.  Neurological:  He has good  strength in the upper extremities and lower extremities bilaterally.  He  has no paresthesias.   I have reviewed his carotid duplex scan which shows a greater than 80%  right carotid stenosis and also reviewed his cerebral arteriogram which  confirms this finding.  The stenosis extends over approximately 2 cm and  does appear to be surgically accessible.   Given the stenosis of greater than 80% I have recommended right carotid  endarterectomy in order to lower his risk of future stroke.  We have  discussed the indications for the procedure and potential complications  including but not limited to bleeding, stroke (periprocedural risk 1-  2%),  nerve injury, MI, or other unpredictable medical problems.  I had  previously discussed the case with Dr. Jacinto [name] and we have agreed that he  will need to stay on Plavix which will put him at slightly increased  risk for postoperative bleeding complications.  He is also currently on  325 mg a day of aspirin and when he sees Dr. Jacinto [name] on 12/21/2007 we will  see if it is possible to either discontinue the aspirin or cut it back  to 81 mg a day.  We will also be sure that it is safe to proceed from a  cardiac standpoint with his surgery next week.  Tentatively we scheduled  right carotid endarterectomy for 12/29/2007.  Of note, his  catheterization on September 14 showed that the LIMA to the LAD was  patent.  The saphenous vein graft to the diagonal was patent.  The right  coronary artery was nondominant.  The circumflex had a 90% stenosis of  the takeoff of OM1.  The takeoff of the OM2 had a subtotal occlusion.  The jump graft between the OM2 and distal circumflex were patent.  He  underwent successful PTCA and stenting of the circumflex and a PTCA of  the OM1.   We will proceed with surgery next Thursday assuming there are no  contraindications from Dr. Verl [name] standpoint.   Cody [name]. Cody [name], M.[name].  Electronically Signed  CSD/MEDQ  [name]:  12/20/2007  T:  12/21/2007  Job:  [id number]   cc:   Cody [name]. Cody Halim, MD

## 2010-08-01 NOTE — H&P (Signed)
NAME:  Cody [name] NO.:  [id number]   MEDICAL RECORD NO.:  [id number]          PATIENT TYPE:  EMS   LOCATION:  MAJO                         FACILITY:  [facility]   PHYSICIAN:  [name], MD DATE OF BIRTH:  02-02-1927   DATE OF ADMISSION:  10/21/2005  DATE OF DISCHARGE:                                HISTORY & PHYSICAL   Cody [name] is a 77 year old white man who is admitted to [facility] after presenting to the emergency department with atrial  fibrillation.  It resolved in the emergency department with a single dose of  diltiazem 20 mg IV.  He has a history of paroxysmal atrial fibrillation.   The patient has experienced intermittent episodes of atrial fibrillation for  a number of years.  These have been occurring more frequently in the last  week.  He experienced the onset of atrial fibrillation this morning, soon  after awakening.  It has continued throughout the course of the day.  There  has been no syncope, near syncope, dizziness, or lightheadedness.  This  evening, on the way to the emergency department, he experienced an episode  of chest pain.  This was described as an ache located in a small, focal area  in the mid substernal region.  It did not radiate.  It was associated with  mild dyspnea, mild nausea, and no diaphoresis.  There were no exacerbating,  or ameliorating factors.  The chest discomfort appeared not to be related to  position, activity, meals, or respiration.  The patient feels completely  well at this time.   As noted, the patient has a history of paroxysmal atrial fibrillation.  In  addition, he has a history of coronary artery disease.  In April 2005, he  underwent coronary artery bypass surgery and mitral annuloplasty  (necessitated by ischemic mitral regurgitation).  The patient has congestive  heart failure with an ejection fraction, at the time of cardiac  catheterization, estimated to the range of  40-45%.   The patient has a history of hypertension and dyslipidemia.  There is a  history of postoperative hyperglycemia at the time of his bypass, which has  not necessitated medications since.  There is no history of smoking.  There  is no family history of coronary artery disease.   The patient's past medical history is otherwise unremarkable.   OPERATIONS:  Coronary artery bypass surgery and lumbar laminectomy.   MEDICATIONS:  Aspirin, Coreg, Diovan, Vytorin 10/40, and Prilosec.   SOCIAL HISTORY:  The patient lives with his wife.  He is a retired Engineer, [employer].  He neither smokes cigarettes nor drinks alcohol.   FAMILY HISTORY:  There is no family history of coronary artery disease,  hypertension, or diabetes mellitus.   REVIEW OF SYSTEMS:  No new problems related to his head, eyes, ears, nose,  mouth, throat, lungs, gastrointestinal system, genitourinary system, or  extremities.  There is no history of neurologic, or psychiatric disorder.  There is no history of fever, chills, or weight loss.   PHYSICAL EXAMINATION:  Blood pressure 128/62.  Pulse 66 and  regular.  Respirations 20.  Temperature 98.8.  Pulse ox 97% on room air.  The patient was an elderly white man in no discomfort.  He was alert,  appropriate, responsive, and verbal.  Head, eyes, nose, and mouth were normal.  The neck was without thyromegaly or adenopathy.  Carotid pulses were  palpable bilaterally and without bruits.  Cardiac examination revealed a normal S1 and S2.  There was no S3, S4,  murmur, rub, or click.  Cardiac rhythm is regular.  No chest wall tenderness  was noted.  The lungs were clear.  The abdomen was soft and nontender.  There was no mass, hepatosplenomegaly,  bruits, distension, rebound, guarding, or rigidity.  Bowel sounds were  normal.  Rectal and genital examinations were not performed as they were not  pertinent to the reason for acute care hospitalization.  The  extremities were without edema, deviation, or deformity.  Radial and  dorsalis pedal pulse were palpable bilaterally.  Brief screening neurologic survey was unremarkable.   The initial electrocardiogram demonstrated atrial fibrillation at the rate  of 123 beats per minute.  The post-conversion electrocardiogram demonstrated  normal sinus rhythm.  Left ventricular hypertrophy was present with  secondary repolarization abnormalities.  The chest radiograph demonstrated  probable mild congestive heart failure with loculated pleural fluid on the  left.  A lucency in the left lower lobe was felt to be likely due to fluid  surrounding normal lung, but the possibility of a pneumatocele could not be  excluded.  There was no pneumothorax.  The initial set of cardiac markers  revealed a myoglobin of 166, CK/MB less than 1.0, and troponin less than  0.05.  Potassium was 4.5, BUN 32, and creatinine 1.8.  The remaining studies  were pending at the time of this dictation.   IMPRESSION:  1. Atrial fibrillation with rapid ventricular rate, resolved; history of      paroxysmal atrial fibrillation.  2. Chest pain; rule out cardiac ischemia.  3. Coronary artery disease.  Status post coronary artery bypass surgery      and mitral annuloplasty (for ischemic mitral regurgitation) in April      2005.  4. Congestive heart failure.  Ejection fraction 40-45%.  5. Hypertension.  6. Dyslipidemia.   PLAN:  1. Telemetry.  2. Serial cardiac enzymes.  3. Aspirin.  4. Intravenous heparin.  5. Intravenous nitroglycerin.  6. Further measures per Dr. Jacinto Halim.      [name], MD  Electronically Signed     MSC/MEDQ  D:  10/21/2005  T:  10/22/2005  Job:  [id number]   cc:   [name], MD  Cristy Hilts Jacinto Halim, MD

## 2010-08-01 NOTE — Discharge Summary (Signed)
NAME:  Cody [name], Cody [name] NO.:  [id number]   MEDICAL RECORD NO.:  [id number]          PATIENT TYPE:  INP   LOCATION:  2007                         FACILITY:  [facility]   PHYSICIAN:  Cristy Hilts. Jacinto Halim, MD       DATE OF BIRTH:  1926/09/27   DATE OF ADMISSION:  10/21/2005  DATE OF DISCHARGE:  10/26/2005                                 DISCHARGE SUMMARY   Discharge diagnosis:   Atrial Fibrillation-Paroxysmal  Shortness of breath  ASHD  Pleural effusion  Coronary artery bypass grafting in the past.  Acute and chronic renal failure- improved.  Left lower lobe pneumonis.   HOSPITAL PROCEDURES:  None.   HPI AND HOSPITAL COURSE:  This is a 77 year old gentleman patient of Dr.  Jacinto Halim who was seen in the emergency room on October 21, 2005 by Dr. Darlina Rumpf.  Patient presented with complaints of palpitations.  EKG in the ER  revealed atrial fibrillation which was resolved with single dose of  Diltiazem of 20 mg IV.  The patient has a prior history of paroxysmal atrial  fibrillation.  He also complains of shortness of breath.  Chest x-ray  revealed pleural effusion with airspace disease and the patient was febrile  at admission so he was admitted to the telemetry.  His enzymes were cycled  and revealed negative values x3.   The next morning he was evaluated by Dr. Elsie Lincoln and it felt like his rhythms  on the telemetry were more compatible with accelerated junctional  tachycardia than AFib and unfortunately there were no saved telemetry strips  from the emergency room documentation of AFib rhythm on arrival.  Because of  patient's fever and leukocytosis and abnormal chest x-ray, a pulmonology  consult was ordered.  He was seen by Dr. Delford Field and diagnosed with sepsis  and acute hypoxic respiratory failure, left lower-lobe pneumonia with  parapneumonic effusion and parapneumonic effusion is possibly early  manifestation of a questionable empyema.  Patient was started on Vancomycin  IV  and a respiratory culture was collected and blood culture drawn prior to  initiation of antibiotic therapy.   During this stay, patient was transferred to the ICU after he went into  septic shock with profound hypertension.  He was fluid resuscitated and his  central venous pressure eventually increased from 6 to 8 and to 10.  On  October 23, 2005, he underwent ultrasound-guided diagnostic and therapeutic  thoracentesis with the removal of 530 cc of pleural fluid which was sent to  the laboratory.   The patient was started on Coumadin and heparin to accomplish the heparin  and Coumadin crossover, but eventually patient refused to be on Coumadin and  was discharged home on regular-dose aspirin.   HOSPITAL LABORATORIES:  On admission, the patient's white blood cell count  was 20,000, hemoglobin 16.7, hematocrit 49.0 and platelets 201.  At the time  of discharge, his white blood cell count was 10.3, hemoglobin 15.6,  hematocrit 39.9 and platelet count 184.  ASR 10.  BMP:  Sodium 134,  potassium 4.1, chloride 103, CO2 23, glucose 116, BUN 25, creatinine  1.7,  calcium 7.9.  Cardiac enzymes cycled and all three sets were negative.  Cortisol level was 36.6, pleural fluid LDH was 105 which was elevated, did  reveal less than 3 mg/dl of protein, 914 mg/dl of glucose, there were no  white blood cells and no organisms seen in pleural fluid.  Blood cultures  were negative x2, but clean-catch urine revealed Klebsiella pneumoniae.   Pleural fluid cytology report is not available to me at the time of  discharge.  It is not on file in the chart.   His chest x-ray on admission revealed partial loculated pleural effusion on  the left with probable mild pulmonary edema, round lucency in the left lower  lobe may be secondary to adjacent pleural fluid or pneumatocele.  There is  no evidence of pneumothorax.  The last chest x-ray while in hospital showed  interstitial edema, mild and patulous lower-lobe  density.   DISCHARGE DIET:  Low salt, low fat, low-cholesterol diet.   DISCHARGE INSTRUCTIONS:  The patient was instructed to increase activities  slowly.   DISCHARGE MEDICATIONS:  1. Aspirin 325 mg q.day.  2. Vytorin 10/40 mg q.day.  3. Spiriva 18 mcg q.day.  4. Coreg 25 mg a day.  5. Protonix 40 mg q.day although patient may continue with Prilosec.  6. Diovan 160/12.5 mg q.day.  7. Levaquin 750 mg for seven days p.o.   Follow up at Dr. Jacinto Halim, [facility] will contact the  patient to schedule him for an appointment with Dr. Jacinto Halim in two weeks.      Raymon Mutton, P.A.      Cristy Hilts. Jacinto Halim, MD  Electronically Signed    MK/MEDQ  D:  11/11/2005  T:  11/11/2005  Job:  [id number]

## 2010-08-01 NOTE — Cardiovascular Report (Signed)
NAME:  JAQUALYN, Cody [name] NO.:  [id number]   MEDICAL RECORD NO.:  [id number]                   PATIENT TYPE:  INP   LOCATION:  3707                                 FACILITY:  [facility]   PHYSICIAN:  Cristy Hilts. Jacinto Halim, M.D.                  DATE OF BIRTH:  04-Nov-1926   DATE OF PROCEDURE:  07/09/2003  DATE OF DISCHARGE:                              CARDIAC CATHETERIZATION   PROCEDURE PERFORMED:  1. Left ventriculography.  2. Selective right and left coronary arteriography.  3. Abdominal aortogram.  4. Right femoral angiography and closure of right femoral arterial access     with Angio-Seal.   INDICATION:  Mr. Cody [name] is __________healthy 77 year old gentleman  with history of hypertension who had been complaining of chest pain and  shortness of breath and paroxysmal nocturnal dyspnea which started about two  weeks ago.  He was admitted to the hospital with new onset of atrial flutter  with classic anginal symptoms and shortness of breath.  He was brought to  the cardiac catheterization to evaluate his coronary anatomy with high  suspicion for coronary disease.   Abdominal aortogram was performed to evaluate for abdominal atherosclerosis.   HEMODYNAMIC DATA:  1. The left ventricular pressures were 122/17 with end-diastolic pressure of     .  2. Aortic pressure 129/52 with a mean of 100 mmHg.  3. There was no pressure gradient across the aortic valve.   ANGIOGRAPHIC DATA:  Left ventricle:  Left ventricular systolic function was  mildly depressed with EF estimated around 40-45% with mild global  hypokinesis.  There was no significant mitral regurgitation.   Right coronary artery:  The right coronary artery is a small to medium size  vessel which is nondominant.  The acute marginal branch has eccentric 80%  stenosis for the RV branch.  There was slow filling noted in the RCA.   Left main coronary artery:  Left main coronary artery is a large  caliber  vessel. It is long and normal.   Left anterior descending artery:  Left anterior descending artery is a large  caliber vessel.  It has a focal 90% mid lesion just before the origin of a  moderate size diagonal one.  The diagonal one has ostial 80% stenosis.  The  LAD otherwise is smooth and wraps around the apex.   Circumflex coronary artery:  Circumflex coronary artery is a dominant  vessel.  The vessel is very large caliber vessel.  It gives origin to a  moderate size obtuse marginal one and after the origin of the obtuse  marginal one there is a heavy plaque burden with 80% ulcerated hazy lesion  noted in the mid circumflex.  At this lesion site, there is also an origin  of a moderate to large size obtuse marginal two which has ostial 90%  stenosis.  There is a small to moderate size obtuse marginal three  and the  distal circumflex continues as PDA.  There are two large PDA branches and  there is a trifurcation lesion constituting 70-80% stenosis.   ABDOMINAL AORTOGRAM:  Abdominal aortogram revealed widely patent renal  arteries, one on either side.  There is mild luminal irregularity of the  abdominal aorta.   Left subclavian and LIMA:  They are widely patent.   IMPRESSION:  1. Nondominant right coronary artery with right ventricular branch having     80% stenosis at the ostium.  2. Dominant circumflex with mid 80% heavy plaque burden lesion which appears     ulcerated and hazy.  Large obtuse marginal two which has ostial 90%     stenosis.  The distal circumflex which continues as two large PDA has     trifurcation 70-80% stenosis.  3. Mid left anterior descending is 90% focal stenosis.  Diagonal one 80%     ostial stenosis.   RECOMMENDATIONS:  1. Based on the anatomy, the patient will benefit from coronary artery     bypass grafting.  The patient will be admitted for bypass this hospital     admission.  2. Continued aggressive risk factor modification is  indicated.   RECOMMENDATIONS:  Given her severity of symptoms and recurrent chest pain,  will proceed with further evaluation for noncardiac cause of chest pain.  Will ask Dr. Loreta Ave to evaluate her for possible GI symptoms contributing to  her chest pain.   TECHNIQUE OF PROCEDURE:  Under usual sterile precautions, using a 6 French  right femoral arterial access, a 6 [place] multipurpose pigtail catheter was  advanced into the ascending aorta over a 0.035-inch J wire.  The catheter  was then gently advanced to the left ventricle and left ventricular  pressures were monitored.  Hand contrast injection of the left ventricle was  performed both in the LAO and RAO projection.  The catheter was flushed with  saline and pulled back into the ascending aorta and pressure gradient across  the aortic valve was monitored.  The right coronary artery was selectively  engaged and angiography was performed.  In a similar fashion, left main  coronary artery was selectively engaged and angiography was performed.  Then, the left subclavian artery was selectively cannulated and angiography  was repeated.  The catheter was pulled back in the abdominal aorta and  abdominal aortogram was performed.  Right femoral angiography was performed  through the arterial sheath access sheath and access was closed with Angio-  Seal with excellent hemostasis obtained.  The patient was transferred to  recovery in stable condition.  The patient tolerated the procedure well.                                               Cristy Hilts. Jacinto Halim, M.D.    Pilar Plate  D:  07/09/2003  T:  07/09/2003  Job:  [id number]   cc:   [facility]

## 2010-08-01 NOTE — Discharge Summary (Signed)
NAME:  Cody [name], Cody [name] NO.:  [id number]   MEDICAL RECORD NO.:  [id number]                   PATIENT TYPE:  INP   LOCATION:  2025                                 FACILITY:  [facility]   PHYSICIAN:  Kerin Perna, M.D.               DATE OF BIRTH:  1926/05/31   DATE OF ADMISSION:  07/07/2003  DATE OF DISCHARGE:  07/18/2003                                 DISCHARGE SUMMARY   ADMISSION DIAGNOSES:  1. Paroxysmal atrial fibrillation.  2. Congestive heart failure.   ALTERNATIVE DIAGNOSES/DISCHARGE DIAGNOSES:  1. Class IV congestive heart failure.  2. Class IV unstable angina.  3. Severe three vessel coronary artery disease, status post coronary artery     bypass grafting x4 and mitral annuloplasty for ischemic mitral     regurgitation completed on July 10, 2003.  4. Paroxysmal atrial fibrillation treated with anticoagulation therapy.  5. Postoperative anemia.  6. Postoperative acute renal insufficiency which is resolved at the time of     discharge.  7. Hypertension.  8. Status post lumbar laminectomy.  9. Postoperative hyperglycemia which is stabilized at the time of discharge.   PROCEDURES:  1. Cardiac catheterization on July 09, 2003, completed by Dr. Jacinto Halim.  2. Preliminary arterial evaluation for anticipated cardiac surgery completed     on July 09, 2003.  This included bilateral carotid Duplex studies,     bilateral palmar arch or Allen's test, bilateral lower extremity ABIs.  3. Coronary artery bypass grafting x4 and mitral valve ring annuloplasty     with a 28 mm Edwards IMR annuloplasty ring completed on July 10, 2003,     by Dr. Donata Clay.  An intraoperative transesophageal echocardiogram was     also completed.  4. Multiple blood transfusions.   CONSULTATIONS:  1. Diabetes coordinator.  2. Case management.  3. Cardiac rehab.   HISTORY OF PRESENT ILLNESS:  [name] is a pleasant 77 year old white  male who was admitted on July 07, 2003, with class IV congestive heart  failure and class IV unstable angina.  The patient's cardiac enzymes were  negative at the time.  His chest x-ray showed severe congestive heart  failure.  His EKG showed atrial fibrillation with a heart rate of 80.  The  patient was treated with Lasix, nitroglycerin and heparin, and his symptoms  resolved.  The patient was admitted under Dr. Jacinto Halim of [facility] on July 07, 2003.   HOSPITAL COURSE:  [name] was admitted by the cardiology service on  July 07, 2003, with anticipation of a cardiac catheterization to be  completed on July 09, 2003.  The patient remained stable and free of chest  pain  until the planned procedure.  He remained on IV nitroglycerin and  heparin therapy.  He was treated appropriately for congestive heart failure.   The patient was taken to the cardiac catheterization lab and underwent left  ventriculography,  selective right and left coronary arteriography, abdominal  aortogram, and right femoral angiography and closure of right femoral  arterial axis with an Angio-Seal on July 09, 2003.  This demonstrated an  ejection fraction of 40%.  There was no mitral regurgitation.  The left  ventricular end diastolic pressure was 21 mmHg.  The patient had a left  dominant coronary circulation with a proximal 80% stenosis of the  circumflex, 90% stenosis of OM1, 90% stenosis of the distal circumflex, and  a 90% stenosis of the LAD diagonal.  The right coronary was nondominant with  an 80% stenosis of an RV marginal.  Dr. Jacinto Halim felt that the patient would be  an appropriate candidate for surgical coronary revascularization.  A CVTS  consult was initiated.   Dr. Donata Clay of CVTS responded to the cardiac surgery consultation  appropriately on July 09, 2003.  His impression was that, indeed, the  patient had severe coronary artery disease and would benefit from  revascularization with bypass graft planned for the  left anterior descending  artery, diagonal, obtuse marginal #1, distal circumflex and possibly the  right coronary artery.  Dr. Donata Clay discussed the coronary artery bypass  grafting procedure with the patient and his wife including the risks,  benefits, and alternatives.  All issues and questions were addressed.  The  patient was in understanding and agreed to proceed with surgery.   The patient underwent preliminary arterial evaluation for planned cardiac  surgery July 09, 2003.  This included bilateral carotid Duplexes which  showed no significant internal carotid artery stenosis bilaterally.  A right  palmar arch or Allen's test showed obliteration of good flow with radial  compression.  The left palmar arch was within normal limits.  The patient's  lower extremity ABIs were within normal limits at greater than 1.0.  On  July 10, 2003, the patient was taken to the operating room and underwent  coronary artery bypass grafting x4 utilizing the left internal mammary  artery to the left anterior descending, saphenous vein graft to the  diagonal, sequential saphenous vein graft to the first obtuse marginal and  fourth obtuse marginal.  He also underwent mitral annuloplasty for ischemic  mitral regurgitation with a 28 mm Edwards IMR annuloplasty ring.  An  intraoperative transesophageal echocardiogram was completed and showed well  preserved left ventricular function with an ejection fraction estimated at  50%.  There was no mitral regurgitation seen.  All other structures were as  before cardiopulmonary bypass.  Otherwise, the patient tolerated the  procedure well and was transferred to the surgical intensive care unit in  stable condition.  The patient was weaned and extubated on the morning after  surgery.  He had a postoperative anemia and was transfused appropriately.   The patient's postoperative course was complicated somewhat by paroxysmal atrial fibrillation.  This was  controlled with IV antiarrhythmic therapy  which was ultimately converted to p.o. medications.  He was also treated  with anticoagulation therapy immediately postoperatively.  The plan is for  the patient to continue anticoagulation therapy for at least six months.   In addition, the patient had acute renal insufficiency postoperatively.  The  appropriate renal sensitive medications were held and the patient's BUN and  creatinine returned to normal prior to discharge.   The patient was treated appropriately with several blood transfusions for  postoperative anemia.  He is being discharged to home with increasing and  stable hemoglobin and hematocrit.   Otherwise, the patient  made steady progress through his postoperative  course.  At the time of discharge planning, the patient had remained  afebrile and his vital signs were stable.  He was saturating at 94 to 100%  on room air.  His heart was in a regular rate and rhythm, reading normal  sinus rhythm on telemetry.  His weight was at 227 with a preoperative weight  of 216 pounds.  The patient had faint crackles of the left base, otherwise  his lungs were clear.  His abdomen was benign.  His incisions were healing  well.  His sternum was stable.  He was tolerating a regular diet.  He had  resumed normal bowel and bladder function.  The patient had mild pitting  peripheral edema.  The patient was ambulating in the hallways with cardiac  rehab with a steady gait and only mild shortness of breath.   LABORATORY DATA:  Lab values at the time of initiation of discharge planning  are as follows.  PT/INR 22.6 and 2.7 on Jul 17, 2003.  BMP from Jul 17, 2003,  reads sodium 139, potassium 4.7, chloride 106, CO2 28, glucose 117, BUN 33,  and creatinine 1.4.  CBC from Jul 16, 2003, reads wbc 8.6, hemoglobin 9.0,  hematocrit 26.4, and platelet count of 205.  Hemoglobin A1C completed  preoperatively was 5.8.   A two view chest x-ray on Jul 16, 2003, reads  evidence of cardiomegaly.  Evidence of previous cardiac surgery is noted with slight decrease in  interstitial pulmonary edema.  There were tiny bilateral pleural effusions  noted. There was minimal bibasilar atelectasis.   We will continue as planned with discharge to home on Jul 18, 2003, pending  a.m. rounds and no changes in the patient's clinical status.   DISCHARGE MEDICATIONS:  1. Aspirin 81 mg p.o. daily.  2. Toprol XL 50 mg p.o. daily.  3. Lisinopril 20 mg p.o. daily.  4. Zocor 20 mg p.o. daily.  5. Warfarin 2.5 mg p.o. daily.  6. Amiodarone 200 mg twice daily.  7. Lasix 40 mg p.o. daily (for 10 more days).  8. Potassium 20 mEq p.o. daily (for 10 more days).  9. Darvocet-N 100 one tablet every four to six hours as needed for pain.   DISCHARGE INSTRUCTIONS:  1. Activity:  The patient should avoid driving.  He should avoid heavy     lifting or strenuous activity.  He should continue to walk daily.  He    should continue his breathing exercises for one week.  2. Diet:  The patient should follow a low fat, low salt, heart healthy diet.  3. Wound care:  The patient may shower.  He should wash his incisions daily     with soap and water.  He should notify the CVTS office if he has any     increased redness, swelling or drainage from his incisions.   SPECIAL INSTRUCTIONS:  The patient is to obtain PT/INR blood work on Jul 20, 2003, by Dortha Kern, P.A., in [place], [place].  The patient  is to call to set up this appointment date and time.  The office number is  [phone].   FOLLOW UP:  1. The patient is to make an appointment with his cardiologist, Dr. Jacinto Halim,     within two weeks of discharge.  He is to call the office number at [phone] to make this appointment date and time.  2. The patient is to see  Dr. Donata Clay of CVTS on Aug 10, 2003, at 12 p.m.     He is obtaining a chest x-ray at [facility] one hour     prior to this  appointment on Aug 10, 2003, at 11 a.m.  He is to hand-     carry these films to Dr. Zenaida Niece Trigt's office.      Carolyn A. Eustaquio Boyden.                  Kerin Perna, M.D.    CAF/MEDQ  D:  07/17/2003  T:  07/18/2003  Job:  [id number]   cc:   Cristy Hilts. Jacinto Halim, M.D.  [address], [address]  [place]  [place] [address]  Fax: [phone]   Dortha Kern, P.A.  [facility]

## 2010-08-01 NOTE — Op Note (Signed)
NAME:  SHOTA, KOHRS NO.:  [id number]   MEDICAL RECORD NO.:  [id number]                   PATIENT TYPE:  INP   LOCATION:  2307                                 FACILITY:  [facility]   PHYSICIAN:  Kerin Perna III, M.D.           DATE OF BIRTH:  1926/08/09   DATE OF PROCEDURE:  07/10/2003  DATE OF DISCHARGE:                                 OPERATIVE REPORT   OPERATION:  1. Coronary artery bypass grafting x4 (left internal mammary artery to the     left anterior descending, saphenous vein graft to diagonal, sequential     saphenous vein graft to first obtuse marginal and fourth obtuse     marginal).  2. Mitral annuloplasty for ischemic mitral regurgitation with a 28-mm     Edwards IMR annuloplasty ring.   SURGEON:  Kerin Perna, M.D.   ASSISTANTS:  1. Evelene Croon, M.D.  2. Rowe Clack, P.A.-C.   ANESTHESIA:  General by Dr. __________.   PREOPERATIVE DIAGNOSES:  1. Class IV unstable angina.  2. Class IV congestive heart failure.  3. Ischemic mitral regurgitation.  4. Severe three-vessel coronary artery disease with reduced left ventricular     function.   POSTOPERATIVE DIAGNOSES:  1. Class IV unstable angina.  2. Class IV congestive heart failure.  3. Ischemic mitral regurgitation.  4. Severe three-vessel coronary artery disease with reduced left ventricular     function.   INDICATIONS:  The patient is a 77 year old male who presented to the  hospital in congestive heart failure, pulmonary edema, and atrial flutter.  Cardiac enzymes were negative.  A cardiac catheterization showed severe  three-vessel disease with reduced LV function.  There was no clear mitral  regurgitation on the ventriculogram.  He was felt to be a candidate for  surgical revascularization.  Prior to his surgery, I discussed the situation  with the patient and his wife and discussed the indications and expected  benefits of coronary revascularization for treatment  of his severe three-  vessel coronary artery disease and reduced LV function.  I discussed the  benefits of the surgery to include improved survival, relief of symptoms,  and preservation of LV function.  I discussed the risks of surgery to  include risks of MI, CVA, bleeding, infection, and death.  The patient  understood these implications for the surgery and agreed to proceed with the  operation as planned under what I was felt was an informed consent.   Prior to surgery, in the operating room, and transesophageal echo was  performed due to the patient's history of CHF and pulmonary edema.  In fact,  the result of the transesophageal 2-D showed significant mitral  regurgitation of approximately 3+ with a central jet and a picture  consistent with ischemic mitral regurgitation.  I left the operating room  after the study was completed while the nurses were completing the  preparation to discuss the situation  with the family, however, they were out  of the waiting room and I left a message for them that we would repair the  valve, which was not planned in the preoperative discussion.  However, the  mitral regurgitation was significant, the pulmonary artery pressures were  elevated at 55/25 and I felt it was in the patient's best short- and long-  term interest to have the ischemic mitral regurgitation treated during this  operation.   PROCEDURE:  The patient was brought to the operating room and placed supine  on the operating room table, where general anesthesia was induced.  The  chest, abdomen and legs were prepped with Betadine and draped as a sterile  field.  A transesophageal 2-D echo showed moderate-to-severe mitral  regurgitation.  A sternal incision was made as the saphenous vein was  harvested from the right lower extremity using endoscopic vein technique.  The left internal mammary artery was harvested as a pedicle graft from its  origin in the subclavian vessels and was a  good vessel with excellent flow.  Heparin was administered and the ACT was documented as being therapeutic.  A  sternal retractor was placed.  Two pursestrings were placed in the ascending  aorta and right atrium and the patient was cannulated and placed on bypass.  A second venous cannula was placed to provide bicaval venous drainage.  The  coronaries were identified for grafting and cardioplegia cannula was  replaced for both the antegrade and retrograde delivery of cold blood  cardioplegia.  Caval tapes were placed around the superior and inferior vena  cavae.  The intra-atrial groove was dissected out.  The saphenous vein and  mammary artery were prepared for the distal anastomoses and the patient was  cooled to 30 degrees.  The aortic cross-clamp was applied.  Eight hundred  milliliters of cold blood cardioplegia were delivered in split doses between  the antegrade aortic and retrograde coronary sinus catheters.  Topical iced  saline slush was used to augment myocardial preservation.  Septal  temperature dropped to less than 14 degrees.   The distal coronary anastomoses were first performed.  The initial distal  anastomosis was of the diagonal branch of the LAD.  This was a 1.5-mm vessel  with proximal 90% stenosis.  A reversed saphenous graft was sewn end-to-side  with running 7-0 Prolene with good flow through the graft.  The second and  third distal anastomoses consisted of sequential vein grafts to the OM-2,  continuing to the OM-4.  The OM-2 was a 1.5- to 1.7-mm vessel with a  proximal 90% stenosis.  A side-to-side anastomosis with a side branch of the  vein was sewn using running 7-0 Prolene with good flow through the graft.  The third distal anastomosis was the continuation of the sequential vein  graft to the OM-4.  This was a 1.5-mm vessel with a proximal 90% stenosis.  The end of the vein was sewn end-to-side with running 7-0 Prolene and there was good flow through the  graft.  Cardioplegia was redosed.  The fourth  distal anastomosis was to the distal third of the LAD.  This was a 1.7-mm  vessel with a proximal 90% stenosis.  The left IMA pedicle was brought  through an opening created in the left lateral pericardium, was brought down  onto the LAD and sewn end-to-side with a running 8-0 Prolene.  There was  good flow through the anastomosis after briefly flashing the mammary artery  by removing the  vascular pedicle clamp and then reapplying it.  The pedicle  was secured to the epicardium.   Cardioplegia was redosed.  Attention was directed to the mitral valve.  An  atriotomy was performed as the caval tapes were tightened.  The atrial  retractor was positioned to expose the mitral valve.  Exposure of the mitral  valve was extremely difficult due to the patient's heart size and small size  of the mitral valve.  It was the appearance of working through a muscular  tunnel to gain access to the mitral valve.   Analysis of the valve showed the leaflets to be intact without prolapse or  ruptured cord.  There appeared to be ischemic foreshortening and retraction  of P3 with restriction of the P3 leaflet.  It was decided to use an ischemic  ring (McCarthy-Adams) and the anterior leaflet was sized to a 28-mm ring.  With difficulty, 11 sutures were placed around the annulus of 2-0 Ethibond  and then brought through the annuloplasty ring, which was seated, and the  sutures were tied.  The valve was tested and found to have no residual lead.  Atriotomy was closed in 2 layers using a running 4-0 Prolene.  Air was  vented from the heart using the usual de-airing maneuvers as well as a dose  of retrograde warm blood cardioplegia.  The aortic cross-clamp was removed.   The heart was cardioverted back to a regular rhythm.  While the patient was  rewarmed, 2 proximal vein anastomoses were placed on the ascending aorta  using a partial-occluding clamp and a 4.0-mm  punch.  The partial clamp was  removed and the vein grafts were perfused.  The patient resumed a sinus  rhythm.  The patient was rewarmed to 37 degrees.  A new aortic vent was  placed to remove any residual air prior to coming off bypass.  The  cardioplegia cannulas were removed.  Temporary pacing wires were applied.  The lungs were reexpanded and ventilator was resumed.  The patient was  rewarmed to 37 degrees.  He was weaned from bypass on low-dose dopamine and  milrinone with good cardiac output and stable blood pressure.  The 2-D echo  showed improved global LV function without evidence of mitral regurgitation.   Protamine was administered without adverse reaction.  The cannulas were  removed.  The mediastinum was irrigated with warm antibiotic irrigation.  The patient was given a unit of platelets due to evidence of coagulopathy  after completion of the protamine dose and reversal of the heparin.  The patient was also given 1 unit of FFP.  The heart continued to look very good  with good function.  The arterial line in the left radial artery was  dampened and a new femoral artery catheter was placed in the left groin,  which showed a good wave form.  The superior pericardial fat was closed over  the aortic vein grafts.  Two mediastinal and a left pleural chest tube were  placed and brought out through separate incisions.  The sternum was closed  with interrupted steel wire.  The pectoralis fascia was closed with a  running #1 Vicryl.  The subcutaneous and skin were closed with a running  Vicryl and sterile dressings were applied.  Total bypass time was 250  minutes with cross-clamp time of 160 minutes.  Mikey Bussing, M.D.    PV/MEDQ  D:  07/10/2003  T:  07/11/2003  Job:  [id number]   cc:   CVTS Office   [facility]

## 2010-08-01 NOTE — Consult Note (Signed)
NAME:  Cody [name], Cody [name] NO.:  [id number]   MEDICAL RECORD NO.:  [id number]                   PATIENT TYPE:  INP   LOCATION:  3707                                 FACILITY:  [facility]   PHYSICIAN:  Kerin Perna III, M.D.           DATE OF BIRTH:  23-Dec-1926   DATE OF CONSULTATION:  07/09/2003  DATE OF DISCHARGE:                                   CONSULTATION   REQUESTING PHYSICIAN:  Vonna Kotyk R. Jacinto Halim, M.D.   PRIMARY CARE PHYSICIAN:  Dr. Dossie Arbour, [place]   REASON FOR CONSULTATION:  Severe three-vessel coronary artery disease with  Class IV unstable angina and Class IV CHF.   CHIEF COMPLAINT:  Shortness of breath.   HISTORY OF PRESENT ILLNESS:  I was asked to evaluate this 77 year old white  male for potential surgical coronary revascularization for recently  diagnosed severe three-vessel coronary artery disease.  The patient was  admitted with Class IV CHF and Class IV unstable angina on April 23.  His  cardiac enzymes were negative.  His chest x-ray showed CHF.  His EKG showed  atrial fibrillation with a heart rate of 80 to 80.  He was treated with  Lasix, nitroglycerin, and heparin, and his symptoms improved.  He was  checked for amylase and lipase because he apparently had an episode of  pancreatitis this February; however, his laboratory values were normal.   Cardiac catheterization was performed today by Dr. Jacinto Halim. This demonstrated  ejection fraction of 40%.  There was no mitral regurgitation.  Left  ventricular end-diastolic pressure was 21 mmHg.  He had a left-dominant  coronary circulation with a proximal 80% stenosis of the circumflex, 90%  stenosis of the OM-1, 90% stenosis of the distal circumflex, and a 90%  stenosis of the LAD diagonal.  The right coronary was nondominant with an  80% stenosis of an RV marginal.  Based on his coronary anatomy and symptoms,  he was felt to be a candidate for surgical coronary revascularization.   PAST  MEDICAL HISTORY:  1. Hypertension.  2. Status post lumbar laminectomy.   ALLERGIES:  No known allergies.   CURRENT MEDICATIONS:  1. Toprol XL 50 mg daily.  2. Lisinopril 20 mg daily.  3. Aspirin 81 mg daily.   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  He does not  smoke or abuse alcohol.  He is married and has two children.  He lives in  [place], and his primary care physician is Dr. Dossie Arbour.   FAMILY HISTORY:  Negative for CAD, MI,or CABG.   REVIEW OF SYSTEMS:  GENERAL:  No weight loss or fever.  ENT:  Some decline  in vision recently but without a specific diagnosis.  No difficulty  swallowing.  PULMONARY:  No history of pneumonia, chest trauma.  Positive  for dyspnea on exertion and orthopnea recently which led  to his admission.  CARDIAC:  Positive for exertional substernal chest pressure.  No known prior  history of MI.  No history of cardiac murmur.  GI:  Negative for previous  abdominal surgery, jaundice, hepatitis, or change in bowel habits.  GU:  No  symptoms of prostatism, hematuria, or kidney stone.  VASCULAR:  No  claudication, TIA, or DVT.  ENDOCRINE:  No history of diabetes or thyroid  disease.  HEMATOLOGIC:  No prior blood transfusion or bleeding disorder.  NEUROLOGIC:  No stroke or seizure.   PHYSICAL EXAMINATION:  VITAL SIGNS:  He is 6 feet 2 inches, weight 212  pounds.  Blood pressure 126/80, heart rate 74 and irregular in atrial  flutter with varying ventricular response.  GENERAL:  Appearance is that of a pleasant white male in his hospital room  with his wife following cardiac catheterization, in no acute distress.  HEENT:  Normocephalic.  Full EOMs.  Pharynx clear. Dentition adequate.  NECK:  Without JVD, mass, or carotid bruit.  LUNGS:  Clear.  CARDIAC:  Regular rhythm without S3 gallop or rub.  ABDOMEN: Soft, nontender, without mass.  EXTREMITIES:  No  clubbing, edema, or cyanosis.  Peripheral pulses are 2+ in  all extremities.  SKIN:  Without rash or lesion.  NEUROLOGIC:  Alert and oriented x 3 with full motor function.   LABORATORY AND X-RAY DATA:  Vascular studies showed no significant carotid  stenoses.  ABIs are greater than 1.0 bilaterally, and brachial pulses are  within 10 mmHg of each other.   IMPRESSION AND RECOMMENDATIONS:  The patient has severe coronary disease and  would benefit from revascularization with bypass grafts planned to the left  anterior descending artery, diagonal, obtuse marginal #1, distal circumflex,  and possibly the right coronary artery.   I have discussed the coronary artery bypass grafting procedure with the  patient and his wife including the benefits, risks, and alternatives.  All  issues and questions have been addressed, and he understands and agrees to  proceed with the surgery in the morning.   Thank you for the consultation.                                               Mikey Bussing, M.D.    PV/MEDQ  D:  07/09/2003  T:  07/09/2003  Job:  [id number]   cc:   Cristy Hilts. Jacinto Halim, M.D.  [address]  [place]  [place] [address]  Fax: [phone]   Dr. Dossie Arbour, [place]

## 2010-09-12 ENCOUNTER — Telehealth: Payer: Self-pay

## 2010-09-12 MED ORDER — CARVEDILOL 25 MG PO TABS: 25.0000 mg | ORAL_TABLET | Freq: Two times a day (BID) | ORAL | Status: DC

## 2010-09-12 NOTE — Telephone Encounter (Signed)
Needs a refill on Carvedilol sent to [facility].

## 2010-12-11 ENCOUNTER — Telehealth: Payer: Self-pay

## 2010-12-11 MED ORDER — AMLODIPINE BESYLATE 10 MG PO TABS: 10.0000 mg | ORAL_TABLET | Freq: Every day | ORAL | Status: DC

## 2010-12-11 NOTE — Telephone Encounter (Signed)
Refill sent for amlodipine.  

## 2010-12-15 LAB — BASIC METABOLIC PANEL
CO2: 26
CO2: 24
Calcium: 8.5
Chloride: 106
Creatinine, Ser: 1.39
Creatinine, Ser: 1.36
GFR calc Af Amer: 60 — ABNORMAL LOW
GFR calc Af Amer: >60
Potassium: 3.9

## 2010-12-15 LAB — COMPREHENSIVE METABOLIC PANEL
AST: 17
BUN: 24 — ABNORMAL HIGH
CO2: 26
Calcium: 9.4
Chloride: 107
Creatinine, Ser: 1.56 — ABNORMAL HIGH
GFR calc Af Amer: 52 — ABNORMAL LOW
GFR calc non Af Amer: 43 — ABNORMAL LOW
Glucose, Bld: 98
Total Bilirubin: 1

## 2010-12-15 LAB — CBC
HCT: 42.5
HCT: 42.8
HCT: 47.9
Hemoglobin: 14.2
Hemoglobin: 14.3
Hemoglobin: 15.7
MCHC: 33.4
MCHC: 32.8
MCV: 88.8
MCV: 90.2
RBC: 4.79
RBC: 4.78
RBC: 5.31
WBC: 9.5
WBC: 9.2

## 2010-12-15 LAB — PROTIME-INR: Prothrombin Time: 15.6 — ABNORMAL HIGH

## 2010-12-15 LAB — ABO/RH: ABO/RH(D): A POS

## 2010-12-15 LAB — TYPE AND SCREEN

## 2010-12-15 LAB — URINALYSIS, ROUTINE W REFLEX MICROSCOPIC
Bilirubin Urine: NEGATIVE
Glucose, UA: NEGATIVE
Ketones, ur: NEGATIVE
Nitrite: NEGATIVE
Protein, ur: NEGATIVE
pH: 5.5

## 2010-12-15 LAB — APTT: aPTT: 43 — ABNORMAL HIGH

## 2010-12-15 LAB — CK TOTAL AND CKMB (NOT AT ARMC): Relative Index: INVALID

## 2010-12-17 LAB — CK TOTAL AND CKMB (NOT AT ARMC): Total CK: 70

## 2010-12-17 LAB — TROPONIN I: Troponin I: 0.08 — ABNORMAL HIGH

## 2010-12-26 ENCOUNTER — Encounter (INDEPENDENT_AMBULATORY_CARE_PROVIDER_SITE_OTHER): Payer: Medicare Other | Admitting: *Deleted

## 2010-12-26 DIAGNOSIS — R0602 Shortness of breath: Secondary | ICD-10-CM

## 2010-12-26 DIAGNOSIS — I6529 Occlusion and stenosis of unspecified carotid artery: Secondary | ICD-10-CM

## 2010-12-26 DIAGNOSIS — R609 Edema, unspecified: Secondary | ICD-10-CM

## 2010-12-31 ENCOUNTER — Encounter: Payer: Self-pay | Admitting: Cardiovascular Disease

## 2010-12-31 ENCOUNTER — Telehealth: Payer: Self-pay

## 2010-12-31 MED ORDER — SIMVASTATIN 40 MG PO TABS: 40.0000 mg | ORAL_TABLET | Freq: Every evening | ORAL | Status: DC

## 2010-12-31 NOTE — Telephone Encounter (Signed)
Refill sent for simvastatin 40

## 2011-01-05 ENCOUNTER — Ambulatory Visit: Payer: Medicare Other | Admitting: Cardiovascular Disease

## 2011-01-13 ENCOUNTER — Ambulatory Visit (INDEPENDENT_AMBULATORY_CARE_PROVIDER_SITE_OTHER): Payer: Medicare Other | Admitting: Cardiovascular Disease

## 2011-01-13 ENCOUNTER — Encounter: Payer: Self-pay | Admitting: Cardiovascular Disease

## 2011-01-13 DIAGNOSIS — I6529 Occlusion and stenosis of unspecified carotid artery: Secondary | ICD-10-CM

## 2011-01-13 DIAGNOSIS — R0602 Shortness of breath: Secondary | ICD-10-CM

## 2011-01-13 DIAGNOSIS — I2510 Atherosclerotic heart disease of native coronary artery without angina pectoris: Secondary | ICD-10-CM

## 2011-01-13 DIAGNOSIS — E785 Hyperlipidemia, unspecified: Secondary | ICD-10-CM

## 2011-01-13 NOTE — Assessment & Plan Note (Signed)
Continue aggressive lipid management given history of carotid endarterectomy.

## 2011-01-13 NOTE — Assessment & Plan Note (Signed)
Currently with no symptoms of angina. No further workup at this time. Continue current medication regimen. 

## 2011-01-13 NOTE — Patient Instructions (Addendum)
You are doing well. No medication changes were made. Your physician recommends that you return for a FASTING lipid profile: This Thursday 11/1 @ 8:30 AM  Please call us if you have new issues that need to be addressed before your next appt.  The office will contact you for a follow up Appt. In 6 months

## 2011-01-13 NOTE — Progress Notes (Signed)
Patient ID: Cody [name], male    DOB: [birth year], 77 y.o.   MRN: [id number]  HPI Comments: 77 year old male with a history of coronary artery disease, bypass surgery in 1995, peripheral vascular disease with right carotid endarterectomy for 80% lesion, PCI of his left circumflex in 2009 and PTCA of an OM1 at the same time, history of hypertension, hyperlipidemia who presents for followup.  He reports that after starting Lasix, his breathing has been significantly better. He has no complaints of shortness of breath, no chest pain, no other symptoms. He is active and has been working in the garden.  He was last seen in clinic at the end of March 2012. We started Lasix at that time. Echocardiogram was done 2-1/2 weeks later. Echocardiogram showed normal systolic function with mildly elevated right ventricular systolic pressures.  he salts all of his food even before he tastes it. He has been doing this for his whole life. Otherwise he feels well and has no complaints   He reports that his daughter recently passed away from brain cancer.   Cardiac cath November 28 2007 showed that the LIMA to the LAD was patent.  The saphenous vein graft to the diagonal was patent.  The right coronary artery was nondominant.  The circumflex had a 90% stenosis of the takeoff of OM1.  The takeoff of the OM2 had a subtotal occlusion. The jump graft between the OM2 and distal circumflex were patent.  He underwent successful PTCA and stenting of the circumflex and a PTCA of the OM1.    Carotid ultrasound in November 2011 showing 40-59% carotid disease on the left    EKG  shows normal sinus rhythm with rate 65 beats per minute, Right bundle branch block, no significant ST or T wave changes    Outpatient Encounter Prescriptions as of 01/13/2011  Medication Sig Dispense Refill  . amLODipine (NORVASC) 10 MG tablet Take 1 tablet (10 mg total) by mouth daily.  30 tablet  6  . aspirin 81 MG EC tablet Take 81 mg by  mouth daily.        . carvedilol (COREG) 25 MG tablet Take 1 tablet (25 mg total) by mouth 2 (two) times daily with a meal.  60 tablet  6  . furosemide (LASIX) 20 MG tablet Take 1 tablet (20 mg total) by mouth daily as needed.  30 tablet  6  . Omeprazole 20 MG TBEC Take 20 mg by mouth 1 dose over 46 hours.        . simvastatin (ZOCOR) 40 MG tablet Take 1 tablet (40 mg total) by mouth every evening.  30 tablet  6     Review of Systems  Constitutional: Negative.   HENT: Negative.   Eyes: Negative.   Respiratory: Negative.   Cardiovascular: Negative.   Gastrointestinal: Negative.   Musculoskeletal: Negative.   Skin: Negative.   Neurological: Negative.   Hematological: Negative.   Psychiatric/Behavioral: Negative.   All other systems reviewed and are negative.   BP 110/60  Pulse 65  Ht 6\' 2"  (1.88 m)  Wt 210 lb (95.255 kg)  BMI 26.96 kg/m2   Physical Exam  Nursing note and vitals reviewed. Constitutional: He is oriented to person, place, and time. He appears well-developed and well-nourished.  HENT:  Head: Normocephalic.  Nose: Nose normal.  Mouth/Throat: Oropharynx is clear and moist.  Eyes: Conjunctivae are normal. Pupils are equal, round, and reactive to light.  Neck: Normal range of motion. Neck supple. No  JVD present.  Cardiovascular: Normal rate, regular rhythm, S1 normal, S2 normal, normal heart sounds and intact distal pulses.  Exam reveals no gallop and no friction rub.   No murmur heard. Pulmonary/Chest: Effort normal and breath sounds normal. No respiratory distress. He has no wheezes. He has no rales. He exhibits no tenderness.  Abdominal: Soft. Bowel sounds are normal. He exhibits no distension. There is no tenderness.  Musculoskeletal: Normal range of motion. He exhibits no edema and no tenderness.  Lymphadenopathy:    He has no cervical adenopathy.  Neurological: He is alert and oriented to person, place, and time. Coordination normal.  Skin: Skin is warm and  dry. No rash noted. No erythema.  Psychiatric: He has a normal mood and affect. His behavior is normal. Judgment and thought content normal.           Assessment and Plan

## 2011-01-13 NOTE — Assessment & Plan Note (Signed)
We do not have his most recent lipid panel. Previously he was at goal.

## 2011-01-13 NOTE — Assessment & Plan Note (Signed)
Shortness of breath and edema improved with periodic Lasix. No further changes were made. We have encouraged him to watch his salt.

## 2011-01-15 ENCOUNTER — Ambulatory Visit (INDEPENDENT_AMBULATORY_CARE_PROVIDER_SITE_OTHER): Payer: Medicare Other | Admitting: *Deleted

## 2011-01-15 DIAGNOSIS — E785 Hyperlipidemia, unspecified: Secondary | ICD-10-CM

## 2011-01-16 LAB — HEPATIC FUNCTION PANEL
ALT: 8 IU/L (ref 0–44)
Total Bilirubin: 0.7 mg/dL (ref 0.0–1.2)

## 2011-01-16 LAB — LIPID PANEL
Cholesterol, Total: 142 mg/dL (ref 100–199)
HDL: 47 mg/dL (ref >39)
LDL Calculated: 70 mg/dL (ref 0–99)
VLDL Cholesterol Cal: 25 mg/dL (ref 5–40)

## 2011-01-28 ENCOUNTER — Encounter: Payer: Self-pay | Admitting: Cardiovascular Disease

## 2011-03-03 ENCOUNTER — Ambulatory Visit: Payer: Medicare Other | Admitting: Family Medicine

## 2011-04-17 ENCOUNTER — Ambulatory Visit: Payer: Self-pay | Admitting: Family Medicine

## 2011-06-17 ENCOUNTER — Other Ambulatory Visit: Payer: Self-pay | Admitting: Cardiovascular Disease

## 2011-06-17 ENCOUNTER — Other Ambulatory Visit: Payer: Self-pay

## 2011-06-17 MED ORDER — FUROSEMIDE 20 MG PO TABS: 20.0000 mg | ORAL_TABLET | Freq: Every day | ORAL | Status: DC | PRN

## 2011-06-17 NOTE — Telephone Encounter (Signed)
Refill sent for fuorsemide 20 mg take one tablet daily prn.

## 2011-07-14 ENCOUNTER — Encounter: Payer: Self-pay | Admitting: Cardiovascular Disease

## 2011-07-14 ENCOUNTER — Ambulatory Visit (INDEPENDENT_AMBULATORY_CARE_PROVIDER_SITE_OTHER): Payer: Medicare Other | Admitting: Cardiovascular Disease

## 2011-07-14 VITALS — BP 112/66 | HR 90 | Ht 74.0 in | Wt 207.0 lb

## 2011-07-14 DIAGNOSIS — E785 Hyperlipidemia, unspecified: Secondary | ICD-10-CM

## 2011-07-14 DIAGNOSIS — R0602 Shortness of breath: Secondary | ICD-10-CM

## 2011-07-14 DIAGNOSIS — I2510 Atherosclerotic heart disease of native coronary artery without angina pectoris: Secondary | ICD-10-CM

## 2011-07-14 DIAGNOSIS — I6529 Occlusion and stenosis of unspecified carotid artery: Secondary | ICD-10-CM

## 2011-07-14 MED ORDER — FUROSEMIDE 20 MG PO TABS: 20.0000 mg | ORAL_TABLET | Freq: Every day | ORAL | Status: DC | PRN

## 2011-07-14 MED ORDER — AMLODIPINE BESYLATE 10 MG PO TABS: 10.0000 mg | ORAL_TABLET | Freq: Every day | ORAL | Status: DC

## 2011-07-14 MED ORDER — SIMVASTATIN 40 MG PO TABS: 40.0000 mg | ORAL_TABLET | Freq: Every evening | ORAL | Status: DC

## 2011-07-14 MED ORDER — CARVEDILOL 25 MG PO TABS: 25.0000 mg | ORAL_TABLET | Freq: Two times a day (BID) | ORAL | Status: DC

## 2011-07-14 NOTE — Progress Notes (Signed)
Patient ID: Cody [name], male    DOB: [birth year], 78 y.o.   MRN: [id number]  HPI Comments: 78 year old male with a history of coronary artery disease, bypass surgery in 09-10-2003, peripheral vascular disease with right carotid endarterectomy for 80% lesion, PCI of his left circumflex in 2007-09-10 and PTCA of an OM1 at the same time, history of hypertension, hyperlipidemia who presents for followup. He has a long history of exposure to asphalt, previous smoking history (stopped in 09-Sep-1965).  daughter  passed away from brain cancer.  He reports that overall he is doing well. He did have a pneumonia in December 2012 with mild weight loss at that time. He is relatively active. He continues to have mild chronic shortness of breath. No significant cough. No edema, lightheadedness or chest pain. Overall he has no new complaints. He takes Lasix daily.  Previous Echocardiogram showed normal systolic function with mildly elevated right ventricular systolic pressures. He does use significant salt on his food   Cardiac cath November 28 2007 showed that the LIMA to the LAD was patent.  The saphenous vein graft to the diagonal was patent.  The right coronary artery was nondominant.  The circumflex had a 90% stenosis of the takeoff of OM1.  The takeoff of the OM2 had a subtotal occlusion. The jump graft between the OM2 and distal circumflex were patent.  He underwent successful PTCA and stenting of the circumflex and a PTCA of the OM1.    Carotid ultrasound in November 2011 showing 40-59% carotid disease on the left    EKG  shows normal sinus rhythm with rate 90 beats per minute, Right bundle branch block, no significant ST or T wave changes    Outpatient Encounter Prescriptions as of 07/14/2011  Medication Sig Dispense Refill  . amLODipine (NORVASC) 10 MG tablet Take 1 tablet (10 mg total) by mouth daily.  30 tablet  6  . aspirin 81 MG EC tablet Take 81 mg by mouth daily.        . carvedilol (COREG) 25 MG tablet  Take 1 tablet (25 mg total) by mouth 2 (two) times daily with a meal.  60 tablet  6  . furosemide (LASIX) 20 MG tablet Take 1 tablet (20 mg total) by mouth daily as needed.  30 tablet  3  . Omeprazole 20 MG TBEC Take 20 mg by mouth 1 dose over 46 hours.        . simvastatin (ZOCOR) 40 MG tablet Take 1 tablet (40 mg total) by mouth every evening.  30 tablet  6    Review of Systems  Constitutional: Negative.   HENT: Negative.   Eyes: Negative.   Respiratory: Negative.   Cardiovascular: Negative.   Gastrointestinal: Negative.   Musculoskeletal: Negative.   Skin: Negative.   Neurological: Negative.   Hematological: Negative.   Psychiatric/Behavioral: Negative.   All other systems reviewed and are negative.   BP 112/66  Pulse 90  Ht 6\' 2"  (1.88 m)  Wt 207 lb (93.895 kg)  BMI 26.58 kg/m2   Physical Exam  Nursing note and vitals reviewed. Constitutional: He is oriented to person, place, and time. He appears well-developed and well-nourished.  HENT:  Head: Normocephalic.  Nose: Nose normal.  Mouth/Throat: Oropharynx is clear and moist.  Eyes: Conjunctivae are normal. Pupils are equal, round, and reactive to light.  Neck: Normal range of motion. Neck supple. No JVD present.  Cardiovascular: Normal rate, regular rhythm, S1 normal, S2 normal, normal heart sounds and  intact distal pulses.  Exam reveals no gallop and no friction rub.   No murmur heard. Pulmonary/Chest: Effort normal. No respiratory distress. He has decreased breath sounds in the right lower field and the left lower field. He has no wheezes. He has no rales. He exhibits no tenderness.  Abdominal: Soft. Bowel sounds are normal. He exhibits no distension. There is no tenderness.  Musculoskeletal: Normal range of motion. He exhibits no edema and no tenderness.  Lymphadenopathy:    He has no cervical adenopathy.  Neurological: He is alert and oriented to person, place, and time. Coordination normal.  Skin: Skin is warm and  dry. No rash noted. No erythema.  Psychiatric: He has a normal mood and affect. His behavior is normal. Judgment and thought content normal.           Assessment and Plan

## 2011-07-14 NOTE — Assessment & Plan Note (Signed)
Cholesterol is at goal on the current lipid regimen. No changes to the medications were made.  

## 2011-07-14 NOTE — Patient Instructions (Signed)
You are doing well. No medication changes were made.  Please call us if you have new issues that need to be addressed before your next appt.  Your physician wants you to follow-up in: 6 months.  You will receive a reminder letter in the mail two months in advance. If you don't receive a letter, please call our office to schedule the follow-up appointment.   

## 2011-07-14 NOTE — Assessment & Plan Note (Signed)
Currently with no symptoms of angina. No further workup at this time. Continue current medication regimen. 

## 2011-07-14 NOTE — Assessment & Plan Note (Signed)
Mild chronic shortness of breath. We'll continue Lasix daily. Symptoms likely secondary to exposure to asphalt, prior smoking history. He is currently not on inhalers.

## 2011-07-14 NOTE — Assessment & Plan Note (Signed)
40% blockage on the left

## 2011-07-28 ENCOUNTER — Other Ambulatory Visit: Payer: Self-pay | Admitting: *Deleted

## 2011-07-28 MED ORDER — SIMVASTATIN 40 MG PO TABS: 40.0000 mg | ORAL_TABLET | Freq: Every evening | ORAL | Status: DC

## 2011-07-28 NOTE — Telephone Encounter (Signed)
Refilled Simvastatin. 

## 2012-01-13 ENCOUNTER — Other Ambulatory Visit: Payer: Self-pay | Admitting: Cardiology

## 2012-01-13 DIAGNOSIS — I6529 Occlusion and stenosis of unspecified carotid artery: Secondary | ICD-10-CM

## 2012-01-19 ENCOUNTER — Encounter: Payer: Self-pay | Admitting: Cardiovascular Disease

## 2012-01-19 ENCOUNTER — Encounter (INDEPENDENT_AMBULATORY_CARE_PROVIDER_SITE_OTHER): Payer: Medicare Other

## 2012-01-19 ENCOUNTER — Ambulatory Visit (INDEPENDENT_AMBULATORY_CARE_PROVIDER_SITE_OTHER): Payer: Medicare Other | Admitting: Cardiovascular Disease

## 2012-01-19 VITALS — BP 122/62 | HR 62 | Ht 74.0 in | Wt 211.2 lb

## 2012-01-19 DIAGNOSIS — I6529 Occlusion and stenosis of unspecified carotid artery: Secondary | ICD-10-CM

## 2012-01-19 DIAGNOSIS — I2510 Atherosclerotic heart disease of native coronary artery without angina pectoris: Secondary | ICD-10-CM

## 2012-01-19 DIAGNOSIS — E785 Hyperlipidemia, unspecified: Secondary | ICD-10-CM

## 2012-01-19 DIAGNOSIS — R0602 Shortness of breath: Secondary | ICD-10-CM

## 2012-01-19 MED ORDER — CARVEDILOL 25 MG PO TABS: 25.0000 mg | ORAL_TABLET | Freq: Two times a day (BID) | ORAL | Status: DC

## 2012-01-19 NOTE — Assessment & Plan Note (Signed)
Currently with no symptoms of angina. No further workup at this time. Continue current medication regimen. 

## 2012-01-19 NOTE — Assessment & Plan Note (Signed)
Previous smoking history, exposure to asphalt fumes. Chronic mild shortness of breath, stable.

## 2012-01-19 NOTE — Assessment & Plan Note (Signed)
Moderate carotid disease several years ago. Repeat scan pending.

## 2012-01-19 NOTE — Patient Instructions (Addendum)
You are doing well. Please cut the coreg in 1/2 and take twice a day  Please call us if you have new issues that need to be addressed before your next appt.  Your physician wants you to follow-up in: 6 months.  You will receive a reminder letter in the mail two months in advance. If you don't receive a letter, please call our office to schedule the follow-up appointment.

## 2012-01-19 NOTE — Progress Notes (Signed)
Patient ID: Cody [name], male    DOB: June 29, 1926, 78 y.o.   MRN: [id number]  HPI Comments: 78 year old male with a history of coronary artery disease, bypass surgery in 07-31-03, peripheral vascular disease with right carotid endarterectomy for 80% lesion, PCI of his left circumflex in 07/31/2007 and PTCA of an OM1 at the same time, history of hypertension, hyperlipidemia who presents for followup. He has a long history of exposure to asphalt, previous smoking history (stopped in 1965-07-30).  daughter  passed away from brain cancer.  He reports that overall he is doing well.  pneumonia in December 2012 with mild weight loss at that time.  He is relatively active. He continues to have mild chronic shortness of breath. No significant cough. No edema, lightheadedness or chest pain. Overall he has no new complaints. He takes Lasix daily. He has recently been active in his yard, sweeping up leaves. He had dizziness on carvedilol 25 mg twice a day and he decreased the dose to 25 mg in the morning only.  Previous Echocardiogram showed normal systolic function with mildly elevated right ventricular systolic pressures. He does use significant salt on his food   Cardiac cath November 28 2007 showed that the LIMA to the LAD was patent.  The saphenous vein graft to the diagonal was patent.  The right coronary artery was nondominant.  The circumflex had a 90% stenosis of the takeoff of OM1.  The takeoff of the OM2 had a subtotal occlusion. The jump graft between the OM2 and distal circumflex were patent.  He underwent successful PTCA and stenting of the circumflex and a PTCA of the OM1.    Carotid ultrasound in November 2011 showing 40-59% carotid disease on the left    EKG  shows normal sinus rhythm with rate 62 beats per minute, Right bundle branch block, no significant ST or T wave changes    Outpatient Encounter Prescriptions as of 01/19/2012  Medication Sig Dispense Refill  . amLODipine (NORVASC) 10 MG tablet  Take 1 tablet (10 mg total) by mouth daily.  90 tablet  3  . aspirin 81 MG EC tablet Take 81 mg by mouth daily.        . carvedilol (COREG) 25 MG tablet Take 25 mg by mouth daily.      . furosemide (LASIX) 20 MG tablet Take 1 tablet (20 mg total) by mouth daily as needed.  90 tablet  3  . Omeprazole 20 MG TBEC Take 20 mg by mouth 1 dose over 46 hours.        . simvastatin (ZOCOR) 40 MG tablet Take 1 tablet (40 mg total) by mouth every evening.  90 tablet  3    Review of Systems  Constitutional: Negative.   HENT: Negative.   Eyes: Negative.   Respiratory: Positive for shortness of breath.        Chronic  Cardiovascular: Negative.   Gastrointestinal: Negative.   Musculoskeletal: Negative.   Skin: Negative.   Neurological: Negative.   Hematological: Negative.   Psychiatric/Behavioral: Negative.   All other systems reviewed and are negative.   BP 122/62  Pulse 62  Ht 6\' 2"  (1.88 m)  Wt 211 lb 4 oz (95.822 kg)  BMI 27.12 kg/m2 who  Physical Exam  Nursing note and vitals reviewed. Constitutional: He is oriented to person, place, and time. He appears well-developed and well-nourished.  HENT:  Head: Normocephalic.  Nose: Nose normal.  Mouth/Throat: Oropharynx is clear and moist.  Eyes: Conjunctivae normal  are normal. Pupils are equal, round, and reactive to light.  Neck: Normal range of motion. Neck supple. No JVD present.  Cardiovascular: Normal rate, regular rhythm, S1 normal, S2 normal, normal heart sounds and intact distal pulses.  Exam reveals no gallop and no friction rub.   No murmur heard. Pulmonary/Chest: Effort normal. No respiratory distress. He has decreased breath sounds in the right lower field and the left lower field. He has no wheezes. He has no rales. He exhibits no tenderness.       Rales at the bases bilaterally  Abdominal: Soft. Bowel sounds are normal. He exhibits no distension. There is no tenderness.  Musculoskeletal: Normal range of motion. He exhibits no  edema and no tenderness.  Lymphadenopathy:    He has no cervical adenopathy.  Neurological: He is alert and oriented to person, place, and time. Coordination normal.  Skin: Skin is warm and dry. No rash noted. No erythema.  Psychiatric: He has a normal mood and affect. His behavior is normal. Judgment and thought content normal.           Assessment and Plan

## 2012-01-19 NOTE — Assessment & Plan Note (Signed)
Cholesterol is at goal on the current lipid regimen. No changes to the medications were made.  

## 2012-05-25 ENCOUNTER — Telehealth: Payer: Self-pay | Admitting: *Deleted

## 2012-05-25 NOTE — Telephone Encounter (Signed)
LMTCB

## 2012-05-25 NOTE — Telephone Encounter (Signed)
error 

## 2012-05-25 NOTE — Telephone Encounter (Signed)
Wife called back Says pt has had MI, CABG, etc C/o worsening DOE and "feeling as if there is a weight on his chest" when he lays down at night Says he can only take a few steps and he is very dyspneic Wife says this is getting worse Denies active pain or discomfort Is only occuring on occasion Needs appt with Dr. Mariah Milling Made appt with Dr. Mariah Milling tomm at 1045 Pt used to have SL NTG but this RX ran out I will send in another RX Pt was reminded how to use this and will go to ER should symptoms not resolve with NTG

## 2012-05-25 NOTE — Telephone Encounter (Signed)
Per pt wife pt c/o of SOB and chest discomfort. States that he cant bend over to tie his shoes.

## 2012-05-26 ENCOUNTER — Encounter: Payer: Self-pay | Admitting: Internal Medicine

## 2012-05-26 ENCOUNTER — Ambulatory Visit (INDEPENDENT_AMBULATORY_CARE_PROVIDER_SITE_OTHER): Payer: Medicare Other | Admitting: Internal Medicine

## 2012-05-26 ENCOUNTER — Ambulatory Visit: Payer: Self-pay | Admitting: Cardiovascular Disease

## 2012-05-26 ENCOUNTER — Encounter: Payer: Self-pay | Admitting: Cardiovascular Disease

## 2012-05-26 ENCOUNTER — Other Ambulatory Visit: Payer: Self-pay

## 2012-05-26 ENCOUNTER — Ambulatory Visit (INDEPENDENT_AMBULATORY_CARE_PROVIDER_SITE_OTHER): Payer: Medicare Other | Admitting: Cardiovascular Disease

## 2012-05-26 VITALS — BP 100/52 | HR 54 | Temp 97.6°F | Ht 74.0 in | Wt 212.8 lb

## 2012-05-26 VITALS — BP 134/60 | HR 77 | Ht 74.0 in | Wt 218.2 lb

## 2012-05-26 DIAGNOSIS — R079 Chest pain, unspecified: Secondary | ICD-10-CM

## 2012-05-26 DIAGNOSIS — R0602 Shortness of breath: Secondary | ICD-10-CM

## 2012-05-26 DIAGNOSIS — I2510 Atherosclerotic heart disease of native coronary artery without angina pectoris: Secondary | ICD-10-CM

## 2012-05-26 DIAGNOSIS — E785 Hyperlipidemia, unspecified: Secondary | ICD-10-CM

## 2012-05-26 DIAGNOSIS — J60 Coalworker's pneumoconiosis: Secondary | ICD-10-CM

## 2012-05-26 DIAGNOSIS — R0609 Other forms of dyspnea: Secondary | ICD-10-CM

## 2012-05-26 DIAGNOSIS — J449 Chronic obstructive pulmonary disease, unspecified: Secondary | ICD-10-CM

## 2012-05-26 DIAGNOSIS — J8410 Pulmonary fibrosis, unspecified: Secondary | ICD-10-CM

## 2012-05-26 DIAGNOSIS — J984 Other disorders of lung: Secondary | ICD-10-CM

## 2012-05-26 LAB — SEDIMENTATION RATE: Sed Rate: 21 mm/hr (ref 0–22)

## 2012-05-26 MED ORDER — NITROGLYCERIN 0.4 MG SL SUBL: 0.4000 mg | SUBLINGUAL_TABLET | SUBLINGUAL | Status: AC | PRN

## 2012-05-26 MED ORDER — OMEPRAZOLE 20 MG PO TBEC: DELAYED_RELEASE_TABLET | ORAL | Status: DC

## 2012-05-26 MED ORDER — ALBUTEROL SULFATE HFA 108 (90 BASE) MCG/ACT IN AERS: 2.0000 | INHALATION_SPRAY | Freq: Four times a day (QID) | RESPIRATORY_TRACT | Status: DC | PRN

## 2012-05-26 NOTE — Progress Notes (Signed)
Patient ID: Cody [name], male    DOB: 07/09/1926, 79 y.o.   MRN: [id number]  HPI Comments: 79 year old male with a history of coronary artery disease, bypass surgery in 08/06/2003, peripheral vascular disease with right carotid endarterectomy for 80% lesion, PCI of his left circumflex in 08/06/2007 and PTCA of an OM1 at the same time, history of hypertension, hyperlipidemia who presents for followup. He has a long history of exposure to asphalt, previous smoking history (smoked for 25 years,stopped in 08-05-1965).  daughter  passed away from brain cancer.he was told by cardiothoracic surgeon at the time of his bypass that he had severe "black lungs".   pneumonia in December 2012 and in November 2013. In the past he has had mild chronic shortness of breath. not on inhalers on a regular basis. Today he reports that shortness of breath has been worse over the past several months. Wife reports that he also feels weak. He has a chronic mild cough, nothing worse. Symptoms not worse when supine. No lower extremity edema or abdominal swelling. He and his wife do not know if it is his lungs or his heart. He did have similar symptoms of shortness of breath prior to stent placement in the past.  He does report having shortness of breath when he bends over to tie his shoes.  shortness breath with any exertion and has to stop to recover.  No edema, lightheadedness or chest pain.  He takes Lasix periodically, not a regular basis.  Previous Echocardiogram showed normal systolic function with mildly elevated right ventricular systolic pressures. He does use significant salt on his food   Cardiac cath November 28 2007 showed that the LIMA to the LAD was patent.  The saphenous vein graft to the diagonal was patent.  The right coronary artery was nondominant.  The circumflex had a 90% stenosis of the takeoff of OM1.  The takeoff of the OM2 had a subtotal occlusion. The jump graft between the OM2 and distal circumflex were patent.   He underwent successful PTCA and stenting of the circumflex and a PTCA of the OM1.    Carotid ultrasound in November 2011 showing 40-59% carotid disease on the left    EKG  shows normal sinus rhythm with rate 77  beats per minute, Right bundle branch block, no significant ST or T wave changes    Outpatient Encounter Prescriptions as of 05/26/2012  Medication Sig Dispense Refill  . amLODipine (NORVASC) 10 MG tablet Take 1 tablet (10 mg total) by mouth daily.  90 tablet  3  . aspirin 81 MG EC tablet Take 81 mg by mouth daily.        . carvedilol (COREG) 25 MG tablet Take 1 tablet (25 mg total) by mouth 2 (two) times daily with a meal.  180 tablet  3  . furosemide (LASIX) 20 MG tablet Take 1 tablet (20 mg total) by mouth daily as needed.  90 tablet  3  . Omeprazole 20 MG TBEC Take 20 mg by mouth 1 dose over 46 hours.        . simvastatin (ZOCOR) 40 MG tablet Take 1 tablet (40 mg total) by mouth every evening.  90 tablet  3  . nitroGLYCERIN (NITROSTAT) 0.4 MG SL tablet Place 1 tablet (0.4 mg total) under the tongue every 5 (five) minutes as needed for chest pain.  25 tablet  3   No facility-administered encounter medications on file as of 05/26/2012.    Review of Systems  Constitutional: Negative.   HENT: Negative.   Eyes: Negative.   Respiratory: Positive for shortness of breath.        Chronic  Cardiovascular: Negative.   Gastrointestinal: Negative.   Musculoskeletal: Negative.   Skin: Negative.   Neurological: Negative.   Psychiatric/Behavioral: Negative.   All other systems reviewed and are negative.   BP 134/60  Pulse 77  Ht 6\' 2"  (1.88 m)  Wt 218 lb 4 oz (98.998 kg)  BMI 28.01 kg/m2 who  Physical Exam  Nursing note and vitals reviewed. Constitutional: He is oriented to person, place, and time. He appears well-developed and well-nourished.  HENT:  Head: Normocephalic.  Nose: Nose normal.  Mouth/Throat: Oropharynx is clear and moist.  Eyes: Conjunctivae are normal.  Pupils are equal, round, and reactive to light.  Neck: Normal range of motion. Neck supple. No JVD present.  Cardiovascular: Normal rate, regular rhythm, S1 normal, S2 normal, normal heart sounds and intact distal pulses.  Exam reveals no gallop and no friction rub.   No murmur heard. Pulmonary/Chest: Effort normal. No respiratory distress. He has decreased breath sounds in the right lower field and the left lower field. He has no wheezes. He has rales. He exhibits no tenderness.  Rales at the bases bilaterally  Abdominal: Soft. Bowel sounds are normal. He exhibits no distension. There is no tenderness.  Musculoskeletal: Normal range of motion. He exhibits no edema and no tenderness.  Lymphadenopathy:    He has no cervical adenopathy.  Neurological: He is alert and oriented to person, place, and time. Coordination normal.  Skin: Skin is warm and dry. No rash noted. No erythema.  Psychiatric: He has a normal mood and affect. His behavior is normal. Judgment and thought content normal.      Assessment and Plan

## 2012-05-26 NOTE — Progress Notes (Signed)
  Subjective:    Patient ID: Cody [name], male    DOB: 27-Dec-1926,  MRN: [id number]  HPI  57 yowm quit smoking 1967 with no breathing problems at all until 2005 with onset of heart problems referred by Dr Hebert Soho to the pulmonary clinic in [facility] 05/26/2012 for eval of copd and ? Ild.   05/26/2012 1st pulmonary eval/ Wert cc doe x could still do yardwork 30 min at then have to rest since 2009 much worse x 4 months not coughing worse walking across the parking lot but not across. eval by Dr Raynald Kemp on same day as 1st ov and rec double dose lasix but denies any real connection chronologically between the swelling and the onset of his peripheral edema and has no orthopnea/ pnd  No obvious daytime variabilty or assoc chronic cough or cp or chest tightness, subjective wheeze overt sinus or hb symptoms. No unusual exp hx or h/o childhood pna/ asthma or premature birth to his knowledge. No arthritis, no h/o exp to chemo or amiodarone.  Sleeping ok without nocturnal  or early am exacerbation  of respiratory  c/o's or need for noct saba. Also denies any obvious fluctuation of symptoms with weather or environmental changes or other aggravating or alleviating factors except as outlined above      Review of Systems  Constitutional: Positive for appetite change. Negative for fever, chills, activity change and unexpected weight change.  HENT: Negative for congestion, sore throat, rhinorrhea, sneezing, trouble swallowing, dental problem, voice change and postnasal drip.   Eyes: Negative for visual disturbance.  Respiratory: Positive for shortness of breath. Negative for cough and choking.   Cardiovascular: Positive for leg swelling. Negative for chest pain.  Gastrointestinal: Negative for nausea, vomiting and abdominal pain.  Genitourinary: Negative for difficulty urinating.  Musculoskeletal: Negative for arthralgias.  Skin: Negative for rash.  Psychiatric/Behavioral: Negative for behavioral problems  and confusion.       Objective:   Physical Exam  Wt Readings from Last 3 Encounters:  05/26/12 212 lb 12.8 oz (96.525 kg)  05/26/12 218 lb 4 oz (98.998 kg)  01/19/12 211 lb 4 oz (95.822 kg)    HEENT mild turbinate edema.  Oropharynx no thrush or excess pnd or cobblestoning.  No JVD or cervical adenopathy. Mild accessory muscle hypertrophy. Trachea midline, nl thryroid. Chest was slt hyperinflated by percussion with diminished breath sounds and mild increased exp time without wheeze on exp but a few insp crackles in r > l base Hoover sign positive at mid inspiration. Regular rate and rhythm without murmur gallop or rub or increase P2 - POS bilateral 1+ pitting lower ext sym edema.  Abd: no hsm, nl excursion. Ext warm without cyanosis or clubbing.    cxr 05/26/12 Pos int lung dz      Assessment & Plan:

## 2012-05-26 NOTE — Patient Instructions (Addendum)
We need to schedule you for PFT's and next blood draw do a ESR  Be sure that you continue daily prilosec (omeprazole) 20 mg  Take 30-60 min before first meal of the day   GERD (REFLUX)  is an extremely common cause of respiratory symptoms just like yours, many times with no significant heartburn at all.    It can be treated with medication, but also with lifestyle changes including avoidance of late meals, excessive alcohol, smoking cessation, and avoid fatty foods, chocolate, peppermint, colas, red wine, and acidic juices such as orange juice.  NO MINT OR MENTHOL PRODUCTS SO NO COUGH DROPS  USE SUGARLESS CANDY INSTEAD (jolley ranchers or Stover's)  NO OIL BASED VITAMINS - use powdered substitutes.    Please schedule a follow up office visit in 4 weeks, sooner if needed

## 2012-05-26 NOTE — Assessment & Plan Note (Signed)
Etiology of his significant shortness of breath is uncertain though concerning for underlying lung disease. He reports significant exposure to ashphalt. Also prior smoking history. He reports doing better in the past on a certain inhaler but he does not know the name. Unable to exclude ischemia as a cause of his symptoms. He will see pulmonary in [place] today. Chest x-ray has been ordered, he will also take Lasix today and tomorrow. Unable to exclude pulmonary hypertension given his underlying lung disease. No significant signs of fluid retention or weight gain concerning for heart failure. Basic metabolic and BNP has been drawn/pending.   If no relief in the next week with inhaler, diuretic, we consider cardiac catheterization possibly with right heart cath.

## 2012-05-26 NOTE — Assessment & Plan Note (Signed)
Prior bypass and stenting.if no relief in his shortness of breath, could schedule cardiac catheterization

## 2012-05-26 NOTE — Assessment & Plan Note (Signed)
25 year smoking history, also with significant asphalt exposure. Prior cardiothoracic surgeon reporting he had "black lung". Now with worsening shortness of breath

## 2012-05-26 NOTE — Assessment & Plan Note (Signed)
-   05/26/2012  Walked RA x 400  stopped due to  desat to 88 then after after 100 more feet 85%  DDx for pulmonary fibrosis  includes idiopathic pulmonary fibrosis, pulmonary fibrosis associated with rheumatologic diseases (which have a relatively benign course in most cases) , adverse effect from  drugs such as chemotherapy or amiodarone exposure, nonspecific interstitial pneumonia which is typically steroid responsive, and chronic hypersensitivity pneumonitis.   In active  smokers Langerhan's Cell  Histiocyctosis (eosinophilic granuomatosis),  DIP,  and Respiratory Bronchiolitis ILD also need to be considered,   Use of PPI is associated with improved survival time and with decreased radiologic fibrosis per King's study published in AJRCCM vol 184 p1390.  Dec 2011  This may not be cause and effect, but given how universally unhelpful all the otherstudy drugs have been for pf,   rec start  rx ppi / diet/ lifestyle modification.   Will max gerd rx and check esr, recheck serial walking sats and get baseline pft's before considering additional steps at age 79 but most likely this is incipient uip

## 2012-05-26 NOTE — Patient Instructions (Addendum)
  We will check some blood work today (BMP and BNP) Chest X ray today  Please take lasix two tabs today and two tabs tomorrow with bananas or citrus for potassium Please start albuterol inhaler every four hours for shortness of breath  Please call us if you have new issues that need to be addressed before your next appt.  2 weeks   Patient also needs a referral for pulmonology at the [facility] office.

## 2012-05-26 NOTE — Assessment & Plan Note (Signed)
We have suggested he stay on his simvastatin

## 2012-05-27 LAB — BASIC METABOLIC PANEL
BUN: 27 mg/dL (ref 8–27)
Calcium: 9.3 mg/dL (ref 8.6–10.2)
Creatinine, Ser: 1.55 mg/dL — ABNORMAL HIGH (ref 0.76–1.27)
GFR calc non Af Amer: 41 mL/min/{1.73_m2} — ABNORMAL LOW (ref >59)
Glucose: 121 mg/dL — ABNORMAL HIGH (ref 65–99)
Sodium: 141 mmol/L (ref 134–144)

## 2012-05-27 LAB — BRAIN NATRIURETIC PEPTIDE: BNP: 123.6 pg/mL — ABNORMAL HIGH (ref 0.0–100.0)

## 2012-05-27 NOTE — Progress Notes (Signed)
Quick Note:  Spoke with pt's spouse and notified of results per MW. She verbalized understanding and will inform the pt ______

## 2012-06-08 ENCOUNTER — Encounter: Payer: Self-pay | Admitting: Cardiovascular Disease

## 2012-06-08 ENCOUNTER — Ambulatory Visit (INDEPENDENT_AMBULATORY_CARE_PROVIDER_SITE_OTHER): Payer: Medicare Other | Admitting: Cardiovascular Disease

## 2012-06-08 VITALS — BP 112/62 | HR 75 | Ht 74.0 in | Wt 209.2 lb

## 2012-06-08 DIAGNOSIS — I509 Heart failure, unspecified: Secondary | ICD-10-CM

## 2012-06-08 DIAGNOSIS — R0602 Shortness of breath: Secondary | ICD-10-CM

## 2012-06-08 DIAGNOSIS — I2510 Atherosclerotic heart disease of native coronary artery without angina pectoris: Secondary | ICD-10-CM

## 2012-06-08 DIAGNOSIS — I5033 Acute on chronic diastolic (congestive) heart failure: Secondary | ICD-10-CM

## 2012-06-08 DIAGNOSIS — E785 Hyperlipidemia, unspecified: Secondary | ICD-10-CM

## 2012-06-08 NOTE — Assessment & Plan Note (Signed)
We'll hold off on cardiac catheterization at this time given improvement of his symptoms

## 2012-06-08 NOTE — Assessment & Plan Note (Signed)
Improvement in symptoms after Lasix twice a day. We have asked him to closely watch his weight and keep his home weight around 202. For any weight gain, he will take Lasix twice a day. Suspect he has pulmonary hypertension/diastolic heart failure.

## 2012-06-08 NOTE — Assessment & Plan Note (Signed)
Improvement of his symptoms back to his baseline with aggressive diuresis. Now taking Lasix periodically.

## 2012-06-08 NOTE — Assessment & Plan Note (Signed)
Cholesterol is at goal on the current lipid regimen. No changes to the medications were made.  

## 2012-06-08 NOTE — Progress Notes (Signed)
Patient ID: Cody [name], male    DOB: 10-22-1926, 79 y.o.   MRN: [id number]  HPI Comments: 79 year old male with a history of coronary artery disease, bypass surgery in 08/01/2003, peripheral vascular disease with right carotid endarterectomy for 80% lesion, PCI of his left circumflex in 08/01/2007 and PTCA of an OM1 at the same time, history of hypertension, hyperlipidemia who presents for followup. He has a long history of exposure to asphalt, previous smoking history (smoked for 25 years,stopped in Jul 31, 1965).  daughter  passed away from brain cancer.he was told by cardiothoracic surgeon at the time of his bypass that he had severe "black lungs".   pneumonia in December 2012 and in November 2013. In the past he has had mild chronic shortness of breath. not on inhalers on a regular basis. On his last clinic visit, he reported worsening shortness of breath. There was concern of ischemia, worsening underlying lung pathology. He was started on high dose Lasix for several days. He was seen by pulmonary on the same day as his cardiology visit with recommendations made. He did not followup with PFTs secondary to bad weather.  He presents today and reports feeling much better after taking Lasix twice a day for 3 days. He continued on twice a day dosing until he called our office and was told to decrease the dosing to daily. Currently he takes Lasix as needed. Weight is 202 pounds at home. Overall he feels much better and has no complaints. He is able to walk further, wife is very happy. He feels more like himself.   Previous Echocardiogram showed normal systolic function with mildly elevated right ventricular systolic pressures. He does use significant salt on his food   Cardiac cath November 28 2007 showed that the LIMA to the LAD was patent.  The saphenous vein graft to the diagonal was patent.  The right coronary artery was nondominant.  The circumflex had a 90% stenosis of the takeoff of OM1.  The takeoff of the  OM2 had a subtotal occlusion. The jump graft between the OM2 and distal circumflex were patent.  He underwent successful PTCA and stenting of the circumflex and a PTCA of the OM1.    Carotid ultrasound in November 2011 showing 40-59% carotid disease on the left    EKG  shows normal sinus rhythm with rate 77  beats per minute, Right bundle branch block, no significant ST or T wave changes    Outpatient Encounter Prescriptions as of 06/08/2012  Medication Sig Dispense Refill  . albuterol (PROVENTIL HFA;VENTOLIN HFA) 108 (90 BASE) MCG/ACT inhaler Inhale 2 puffs into the lungs every 6 (six) hours as needed for wheezing.  1 Inhaler  2  . amLODipine (NORVASC) 10 MG tablet Take 1 tablet (10 mg total) by mouth daily.  90 tablet  3  . aspirin 81 MG EC tablet Take 81 mg by mouth daily.        . carvedilol (COREG) 25 MG tablet Take 1 tablet (25 mg total) by mouth 2 (two) times daily with a meal.  180 tablet  3  . furosemide (LASIX) 20 MG tablet Take 1 tablet (20 mg total) by mouth daily as needed.  90 tablet  3  . nitroGLYCERIN (NITROSTAT) 0.4 MG SL tablet Place 1 tablet (0.4 mg total) under the tongue every 5 (five) minutes as needed for chest pain.  25 tablet  3  . Omeprazole 20 MG TBEC Take 30-60 min before first meal of the day  30  each    . simvastatin (ZOCOR) 40 MG tablet Take 1 tablet (40 mg total) by mouth every evening.  90 tablet  3   No facility-administered encounter medications on file as of 06/08/2012.    Review of Systems  Constitutional: Negative.   HENT: Negative.   Eyes: Negative.   Respiratory: Positive for shortness of breath.        Chronic  Cardiovascular: Negative.   Gastrointestinal: Negative.   Musculoskeletal: Negative.   Skin: Negative.   Neurological: Negative.   Psychiatric/Behavioral: Negative.   All other systems reviewed and are negative.   BP 112/62  Pulse 75  Ht 6\' 2"  (1.88 m)  Wt 209 lb 4 oz (94.915 kg)  BMI 26.85 kg/m2  SpO2 97% who  Physical Exam   Nursing note and vitals reviewed. Constitutional: He is oriented to person, place, and time. He appears well-developed and well-nourished.  HENT:  Head: Normocephalic.  Nose: Nose normal.  Mouth/Throat: Oropharynx is clear and moist.  Eyes: Conjunctivae are normal. Pupils are equal, round, and reactive to light.  Neck: Normal range of motion. Neck supple. No JVD present.  Cardiovascular: Normal rate, regular rhythm, S1 normal, S2 normal, normal heart sounds and intact distal pulses.  Exam reveals no gallop and no friction rub.   No murmur heard. Pulmonary/Chest: Effort normal. No respiratory distress. He has decreased breath sounds in the right lower field and the left lower field. He has no wheezes. He has rales. He exhibits no tenderness.  Rales at the bases bilaterally  Abdominal: Soft. Bowel sounds are normal. He exhibits no distension. There is no tenderness.  Musculoskeletal: Normal range of motion. He exhibits no edema and no tenderness.  Lymphadenopathy:    He has no cervical adenopathy.  Neurological: He is alert and oriented to person, place, and time. Coordination normal.  Skin: Skin is warm and dry. No rash noted. No erythema.  Psychiatric: He has a normal mood and affect. His behavior is normal. Judgment and thought content normal.      Assessment and Plan

## 2012-06-08 NOTE — Patient Instructions (Addendum)
You are doing well. No medication changes were made.  Continue lasix as needed for shortness of breath or weight gain Cut the amlodipine in 1/2 (5 mg) if blood pressure is running low  Please call us if you have new issues that need to be addressed before your next appt.  Your physician wants you to follow-up in: 6 months.  You will receive a reminder letter in the mail two months in advance. If you don't receive a letter, please call our office to schedule the follow-up appointment.

## 2012-06-23 ENCOUNTER — Ambulatory Visit: Payer: Medicare Other | Admitting: Internal Medicine

## 2012-07-08 ENCOUNTER — Other Ambulatory Visit: Payer: Self-pay | Admitting: *Deleted

## 2012-07-08 MED ORDER — AMLODIPINE BESYLATE 10 MG PO TABS: 10.0000 mg | ORAL_TABLET | Freq: Every day | ORAL | Status: DC

## 2012-07-08 NOTE — Telephone Encounter (Signed)
Refilled Amlodipine sent to [facility] pharmacy.

## 2012-07-25 ENCOUNTER — Other Ambulatory Visit: Payer: Self-pay | Admitting: Cardiovascular Disease

## 2012-07-25 NOTE — Telephone Encounter (Signed)
Refilled Simvastatin sent to [facility].

## 2012-08-09 ENCOUNTER — Other Ambulatory Visit: Payer: Self-pay

## 2012-08-09 NOTE — Telephone Encounter (Signed)
Pt wife, shirley states pt is taking 1/2 pill in a.m. And 1/2 at night, pt needs rx resent to pharmacy for 3 mos supply

## 2012-08-10 ENCOUNTER — Other Ambulatory Visit: Payer: Self-pay | Admitting: *Deleted

## 2012-08-10 MED ORDER — CARVEDILOL 25 MG PO TABS: 12.5000 mg | ORAL_TABLET | Freq: Two times a day (BID) | ORAL | Status: DC

## 2012-08-10 NOTE — Telephone Encounter (Signed)
Pt requesting refill for carvedilol refill sent to [facility] pharmacy. #90 Refill#3.

## 2012-10-21 ENCOUNTER — Emergency Department (HOSPITAL_COMMUNITY): Payer: Medicare Other

## 2012-10-21 ENCOUNTER — Emergency Department (HOSPITAL_COMMUNITY)
Admission: EM | Admit: 2012-10-21 | Discharge: 2012-10-21 | Disposition: A | Discharge status: Home / Self Care | Payer: Medicare Other | Attending: Emergency Medicine | Admitting: Emergency Medicine

## 2012-10-21 ENCOUNTER — Encounter (HOSPITAL_COMMUNITY): Payer: Self-pay | Admitting: Emergency Medicine

## 2012-10-21 DIAGNOSIS — Z7982 Long term (current) use of aspirin: Secondary | ICD-10-CM | POA: Insufficient documentation

## 2012-10-21 DIAGNOSIS — I10 Essential (primary) hypertension: Secondary | ICD-10-CM | POA: Insufficient documentation

## 2012-10-21 DIAGNOSIS — Z862 Personal history of diseases of the blood and blood-forming organs and certain disorders involving the immune mechanism: Secondary | ICD-10-CM | POA: Insufficient documentation

## 2012-10-21 DIAGNOSIS — I2510 Atherosclerotic heart disease of native coronary artery without angina pectoris: Secondary | ICD-10-CM | POA: Insufficient documentation

## 2012-10-21 DIAGNOSIS — Z9861 Coronary angioplasty status: Secondary | ICD-10-CM | POA: Insufficient documentation

## 2012-10-21 DIAGNOSIS — I509 Heart failure, unspecified: Secondary | ICD-10-CM | POA: Insufficient documentation

## 2012-10-21 DIAGNOSIS — Z951 Presence of aortocoronary bypass graft: Secondary | ICD-10-CM | POA: Insufficient documentation

## 2012-10-21 DIAGNOSIS — Z8639 Personal history of other endocrine, nutritional and metabolic disease: Secondary | ICD-10-CM | POA: Insufficient documentation

## 2012-10-21 DIAGNOSIS — R0789 Other chest pain: Secondary | ICD-10-CM | POA: Insufficient documentation

## 2012-10-21 DIAGNOSIS — Z87891 Personal history of nicotine dependence: Secondary | ICD-10-CM | POA: Insufficient documentation

## 2012-10-21 DIAGNOSIS — Z79899 Other long term (current) drug therapy: Secondary | ICD-10-CM | POA: Insufficient documentation

## 2012-10-21 DIAGNOSIS — I5033 Acute on chronic diastolic (congestive) heart failure: Secondary | ICD-10-CM

## 2012-10-21 DIAGNOSIS — R0609 Other forms of dyspnea: Secondary | ICD-10-CM | POA: Insufficient documentation

## 2012-10-21 DIAGNOSIS — R0989 Other specified symptoms and signs involving the circulatory and respiratory systems: Secondary | ICD-10-CM | POA: Insufficient documentation

## 2012-10-21 HISTORY — DX: Pulmonary fibrosis, unspecified: J84.10

## 2012-10-21 HISTORY — DX: Peripheral vascular disease, unspecified: I73.9

## 2012-10-21 HISTORY — DX: Heart failure, unspecified: I50.9

## 2012-10-21 HISTORY — DX: Unspecified right bundle-branch block: I45.10

## 2012-10-21 HISTORY — DX: Nonrheumatic mitral (valve) insufficiency: I34.0

## 2012-10-21 HISTORY — DX: Disorder of arteries and arterioles, unspecified: I77.9

## 2012-10-21 LAB — CBC WITH DIFFERENTIAL/PLATELET
Basophils Absolute: 0 10*3/uL (ref 0.0–0.1)
Basophils Relative: 0 % (ref 0–1)
Eosinophils Absolute: 0.3 10*3/uL (ref 0.0–0.7)
Hemoglobin: 15.6 g/dL (ref 13.0–17.0)
MCH: 30.1 pg (ref 26.0–34.0)
MCHC: 34.4 g/dL (ref 30.0–36.0)
Monocytes Relative: 14 % — ABNORMAL HIGH (ref 3–12)
Neutro Abs: 7.2 10*3/uL (ref 1.7–7.7)
Neutrophils Relative %: 62 % (ref 43–77)
Platelets: 178 10*3/uL (ref 150–400)

## 2012-10-21 LAB — COMPREHENSIVE METABOLIC PANEL
ALT: 6 U/L (ref 0–53)
AST: 15 U/L (ref 0–37)
Albumin: 3.3 g/dL — ABNORMAL LOW (ref 3.5–5.2)
Alkaline Phosphatase: 68 U/L (ref 39–117)
BUN: 24 mg/dL — ABNORMAL HIGH (ref 6–23)
Chloride: 105 mEq/L (ref 96–112)
Potassium: 4 mEq/L (ref 3.5–5.1)
Sodium: 139 mEq/L (ref 135–145)
Total Bilirubin: 0.7 mg/dL (ref 0.3–1.2)
Total Protein: 6.8 g/dL (ref 6.0–8.3)

## 2012-10-21 LAB — TROPONIN I: Troponin I: <0.3 ng/mL (ref <0.30)

## 2012-10-21 MED ORDER — FUROSEMIDE 10 MG/ML IJ SOLN
20.0000 mg | Freq: Once | INTRAMUSCULAR | Status: AC
  Administered 2012-10-21: 20 mg via INTRAVENOUS
  Filled 2012-10-21: qty 2

## 2012-10-21 NOTE — ED Notes (Signed)
Checked with lab to see if they had blood.  Valeria in lab advised they had not received the blood.  Blood was drawn by Angie at [phone].   Calling phlebotomist to check on blood.   Will advise Dr. Judd Lien of information.

## 2012-10-21 NOTE — ED Provider Notes (Addendum)
CSN: [id number]     Arrival date & time 10/21/12  0901 History     First MD Initiated Contact with Patient 10/21/12 [phone]     Chief Complaint  Patient presents with  . Shortness of Breath  . Chest Pain   (Consider location/radiation/quality/duration/timing/severity/associated sxs/prior Treatment) HPI Comments: Patient with past medical history significant for coronary artery disease. He is status post bypass surgery in 2006 and subsequent stent placement in 2009. He presents today with complaints of progressive dyspnea on exertion for the past 2 weeks. He reports "needles in my chest", however denies any fever or productive cough. When he becomes the most short of breath he is able to take nitroglycerin which seems to resolve his symptoms. He does report some swelling of the ankles but denies any calf pain.  Patient is a 79 y.o. male presenting with shortness of breath. The history is provided by the patient.  Shortness of Breath Severity:  Moderate Onset quality:  Gradual Duration:  2 weeks Timing:  Constant Progression:  Worsening Chronicity:  New Context: activity   Relieved by:  Nothing Worsened by:  Nothing tried Ineffective treatments:  None tried Associated symptoms: no abdominal pain, no chest pain, no cough and no fever     Past Medical History  Diagnosis Date  . Hyperlipidemia   . Hypertension   . Coronary artery disease 2009    CABG x 5,PCI of left circumflex, PTCA of an OM1   Past Surgical History  Procedure Laterality Date  . Coronary artery bypass graft  06/2003    x 5, valve repair  . Cardiac catheterization  2009    PCI of left circumflex/ PTCA of an OMI  . Back surgery  1993   Family History  Problem Relation Age of Onset  . Heart attack Father   . Thyroid disease Other   . Hyperlipidemia Other   . Bone cancer Mother    History  Substance Use Topics  . Smoking status: Former Smoker -- 1.00 packs/day for 20 years    Types: Cigarettes    Quit date:  06/12/1965  . Smokeless tobacco: Never Used  . Alcohol Use: No    Review of Systems  Constitutional: Negative for fever.  Respiratory: Positive for shortness of breath. Negative for cough.   Cardiovascular: Negative for chest pain.  Gastrointestinal: Negative for abdominal pain.  All other systems reviewed and are negative.    Allergies  Codeine  Home Medications   Current Outpatient Rx  Name  Route  Sig  Dispense  Refill  . albuterol (PROVENTIL HFA;VENTOLIN HFA) 108 (90 BASE) MCG/ACT inhaler   Inhalation   Inhale 2 puffs into the lungs every 6 (six) hours as needed for wheezing.   1 Inhaler   2   . amLODipine (NORVASC) 10 MG tablet   Oral   Take 1 tablet (10 mg total) by mouth daily.   90 tablet   3   . aspirin 81 MG EC tablet   Oral   Take 81 mg by mouth daily.           . carvedilol (COREG) 25 MG tablet   Oral   Take 0.5 tablets (12.5 mg total) by mouth 2 (two) times daily with a meal.   90 tablet   3   . furosemide (LASIX) 20 MG tablet   Oral   Take 1 tablet (20 mg total) by mouth daily as needed.   90 tablet   3   . nitroGLYCERIN (  NITROSTAT) 0.4 MG SL tablet   Sublingual   Place 1 tablet (0.4 mg total) under the tongue every 5 (five) minutes as needed for chest pain.   25 tablet   3   . Omeprazole 20 MG TBEC      Take 30-60 min before first meal of the day   30 each       BP 141/59  Pulse 76  Temp(Src) 97.6 F (36.4 C) (Oral)  Resp 22  Ht 6\' 2"  (1.88 m)  Wt 204 lb (92.534 kg)  BMI 26.18 kg/m2  SpO2 98% Physical Exam  Nursing note and vitals reviewed. Constitutional: He is oriented to person, place, and time. He appears well-developed and well-nourished. No distress.  HENT:  Head: Normocephalic and atraumatic.  Mouth/Throat: Oropharynx is clear and moist.  Neck: Normal range of motion. Neck supple.  Cardiovascular: Normal rate, regular rhythm and normal heart sounds.   No murmur heard. Pulmonary/Chest: Effort normal. No respiratory  distress. He has no wheezes. He has rales. He exhibits no tenderness.  There are slight rales in the bases bilaterally.  Abdominal: Soft. Bowel sounds are normal. He exhibits no distension. There is no tenderness.  Musculoskeletal: Normal range of motion. He exhibits edema.  There is 1+ edema in the bilateral lower extremities. There is no calf tenderness and Homans sign is absent  Lymphadenopathy:    He has no cervical adenopathy.  Neurological: He is alert and oriented to person, place, and time.  Skin: Skin is warm and dry. He is not diaphoretic.    ED Course   Procedures (including critical care time)  Labs Reviewed  CBC WITH DIFFERENTIAL  COMPREHENSIVE METABOLIC PANEL  TROPONIN I  PRO B NATRIURETIC PEPTIDE   No results found. No diagnosis found.   Date: 10/21/2012  Rate: 70  Rhythm: normal sinus rhythm with pvc's  QRS Axis: normal  Intervals: normal  ST/T Wave abnormalities: nonspecific T wave changes  Conduction Disutrbances:none  Narrative Interpretation:   Old EKG Reviewed: unchanged    MDM  Patient presents here with complaints of dyspnea on exertion. He has a significant cardiac history including bypass surgery in 2006 and stenting in 2009. The workup today reveals an unchanged EKG, negative troponin, but BNP which is elevated at 1400. I suspect his symptoms are CHF related and have consult with [facility] cardiology for admission. He was given 20 mg of IV Lasix with a good response. Patient was updated on the results of the studies and is awaiting evaluation by cardiology.   Geoffery Lyons, MD 10/21/12 1242   Patient seen by Dr. Teressa Lower from cardiology who feels as though the patient is appropriate for discharge following another dose of Lasix and a by mouth dose of potassium. He is to followup with his cardiologist next week and return to the emergency department if he develops worsening shortness of breath, chest pain, or other concerning symptoms.  Geoffery Lyons, MD 10/21/12 [phone]

## 2012-10-21 NOTE — H&P (Signed)
Cardiology Consult  Patient ID: Cody [name] MRN: [id number], DOB: 15-Aug-1926 Date of Encounter: 10/21/2012, 1:42 PM Primary Physician: Clydell Hakim, Cody [name] Primary Cardiologist: Mariah Milling Referring: Dr. Judd Lien  Chief Complaint: SOB Reason for Admission: CHF  HPI: Cody [name] is an 79 y/o M with history of CAD (CABG 2005, stenting 2009), mitral annuloplasty, HTN, HL, pulmonary fibrosis, CHF prior to CABG (most recent EF 50-55% in 2012) who presented to [facility] today with progressive SOB for 1 month. He states the symptoms have been similar to his prior angina, accompanied by needle-like stabbing chest pains. He took a NTG last week which eased the chest pain right away. However, the SOB has continued to get worse. The past 2 days he was hardly able to do any of his ADL's without becoming incredibly dyspneic. He denies any orthopnea, nausea, vomiting, or syncope. He reports compliance with meds but puts salt on everything. He has not been taking any PRN Lasix x 2 weeks because he was instructed to take it for LEE, which he has not had much of. Because of worsening dyspnea, he came to the ER where pbNP 1420, BUN/Cr 24/1.57, WBC 11.7. CXR: CHF and small left effusion. Weight down 5lbs from March. VSS. He received 20mg  IV Lasix with just over 1L UOP and significant improvement in symptoms. Now totally asymptomatic.  He also reports tachypalpitations about once every few months, lasting a few minutes at a time. His HR will go up into the 120's then gradually come back down.  Past Medical History  Diagnosis Date  . Hyperlipidemia   . Hypertension   . Coronary artery disease     a. CABG 2005. b. 2009: PTCA/atherotomy->LPDA, stent to prox-mid LCx, & PTCA of stent jailed OM1.  . Carotid artery disease     a. Prior R CEA. b. Parent RICA s/p CEA with patch angioplasty, 40-59% LICA - f/u recommended 01/2013  . Congestive heart failure     a. EF 40% prior to CABG.  . Mitral regurgitation      a. Ischemic MR s/p mitral ring annuloplasty 2005 at time of CABG.  . Pulmonary fibrosis     Previous tobacco history & asphalt exposure, was told he had severe "black lungs" by cardiothoracic surgeon at time of CABG. Was supposed to have PFTs 05/2012 by pulm but missed due to bad weather.  Marland Kitchen RBBB      Most Recent Cardiac Studies: 2D Echo 06/2010  - Left ventricle: The cavity size was normal. There was mild concentric hypertrophy. Systolic function was normal. The estimated ejection fraction was in the range of 50% to 55%. Wall motion was normal; there were no regional wall motion abnormalities. - Aortic valve: Mild regurgitation. - Mitral valve: Calcified annulus. Transvalvular velocity was increased. The findings are consistent with mild stenosis. Valve area by continuity equation (using LVOT flow): 1.78cm^2. - Left atrium: The atrium was mildly dilated. - Tricuspid valve: Mild regurgitation. - Pulmonary arteries: PA peak pressure: 44mm Hg (S).  Cardiac Cath 2009 Please note that this report references a previous diagnostic cath at the [facility], report that is not available PROCEDURES PERFORMED:  1. Arch aortogram.  2. Four-vessel cerebral arteriogram including intracerebral  arteriography.  3. Percutaneous transluminal coronary angioplasty and AngioScore  atherotomy, left posterior descending artery.  4. Percutaneous transluminal coronary angioplasty and stenting of the  left proximal and mid circumflex coronary artery.  5. Percutaneous transluminal coronary angioplasty and balloon  angioplasty of the stent jailed first  obtuse marginal branch of the  circumflex coronary artery.  INDICATIONS: Cody [name] is a 79 year old gentleman with known  coronary artery disease who had presented with acute inferior and  posterior wall myocardial infarction in 2004, associated with  cardiogenic shock. He had undergone coronary artery bypass graft. He  had presented  in 2005, with unstable angina and he underwent cardiac  catheterization revealing a triple-vessel coronary artery disease and  had undergone coronary bypass grafting. He has been complaining of  increasing angina pectoris and recent stress Myoview had revealed  worsening ischemia in the inferolateral wall with scar, with  superimposed ischemia. Because of the symptoms of chest discomfort and  abnormal stress disease, he underwent cardiac catheterization in  [facility] on October 20, 2007. At that time, he was found  high-grade stenosis of the proximal circumflex and also PDA branch  bifurcation, and the saphenous vein graft chip graft to OM-2 and PDA had  occluded in the proximal segment. However, the skip segment was patent.  He was now brought to the cardiac catheterization lab for possible  angioplasty of the left PDA and also left proximal circumflex coronary  artery.  Arch aortogram and cerebral angiography was performed because of  asymptomatic high-grade 90% stenosis, greater than 90% stenosis of the  right internal carotid artery.  CEREBRAL ARTERIOGRAPHY: Arch aortogram. The arch aortogram performed  in the LAO projection revealed the arch to be type 2 to 2-1/2. It was  Bovine arch with the origin of the left internal carotid artery from the  right innominate artery.  RIGHT CAROTID ARTERIAL SYSTEM: Right common carotid artery was widely  patent. The right internal carotid artery showed a high-grade 99%  stenosis just after the bifurcation of the external carotid artery.  Right subclavian artery and right vertebral artery. Right subclavian  and right vertebral artery was widely patent.  LEFT COMMON CAROTID ARTERY: Left common carotid artery showed at most a  40% stenosis of the internal carotid artery at its origin. Otherwise,  the common carotid and the external carotid artery were widely patent.  LEFT VERTEBRAL ARTERY: Left vertebral artery showed an ostial 80%    stenosis.  Intracerebral circulation. Both the posterior circulation and also the  carotid circulation, both on the left and right were intact without any  filling defects or any obvious aneurysms.  Overall impression of the cerebral circulation includes:  1. High-grade right internal carotid artery 99% stenosis.  2. Left internal carotid artery has 40% stenosis.  3. Left vertebral artery has an ostial 80% stenosis; however, this is  asymptomatic. Both right and left vertebral artery are equally  sized.  RECOMMENDATIONS: Based on the anatomy, he will need evaluation for  carotid endarterectomy. Right common carotid artery and internal  carotid artery is tortuous. I will discuss these findings with vascular  surgeon and make further recommendations.  Cardiac catheterization.  ANGIOGRAPHIC DATA: Successful PTCA and Angioscoring of the left PDA  with a 2.5 x 10-mm angioscope balloon. Multiple inflations were  performed anywhere from 8 to 10 atmospheric pressure with overall  reduction of stenosis from 90% to less than 10%. Brisk flow was noted  without any haziness or dissection.  Successful PTCA and stenting of the proximal and mid segment of the left  dominant circumflex artery with implantation of a 4.0 x 15-mm driver  deployed at 14 atmospheric pressure. Overall, the stenosis was reduced  from 80% to 0%. There was first obtuse marginal branch, which was stent  jailed and had compromised about 90% stenosis was reduced to less than  20% with 2.0 x 10-mm sprinter balloon performed at around 3 atmospheric  pressure. Brisk TIMI 3 flow was established.  RECOMMENDATIONS: The patient will need Plavix at least for a period of  4 weeks. I will refer him for vascular surgical consultation for  carotid endarterectomy and I will leave the timing of this entirely to  the surgeon for carotid endarterectomy, which has got asymptomatic high-  grade stenosis.  Relayed either continuous or aggressive  risk modification. Overall,  excellent angiographic and angioplasty results were obtained on the left  coronary arterial system.  A total of 275 mL of contrast was utilized for cerebral angiography and  cardiac intervention. The patient tolerated the procedure. No  immediate complication was noted.  TECHNIQUE OF PROCEDURE: Under usual sterile precautions, using a 7-  French right femoral artery access a 6-French pigtail catheter was  advanced to the ascending aorta and an arch aortogram was performed in  the LAO projection. The catheter was then pulled out of body over the  [facility] wire and a 6-French JR-4 diagnostic catheter was utilized to  perform cerebral arteriography. The selective cannulation of the right  common carotid, left common carotid, and left vertebral artery was  performed and angiography was performed. Right vertebral artery was  subselectively visualized using cannulation of his right subclavian  artery. The catheter was then pulled out of the body in the usual  fashion over a J-wire.  TECHNIQUE OF CARDIAC INTERVENTION: The 7-French FL-4.5 guide was  utilized to engage left main coronary artery. Using ATW guidewire and  heparin and Integrilin for anticoagulation, the wire was carefully  advanced into the distal dominant circumflex coronary artery and placed  in the PDA branch. A 2.5 x 10-mm AngioSculpt balloon was utilized for  performing multiple scoring across the PDA lesion. Having performed  this, I left the lesion alone and used the same cutting balloon at high  pressures into the proximal circumflex coronary artery followed by  stenting with a 4.0 x 15 mm driver at 14 atmospheric pressure for a  minute and then I went ahead and used the same guidewire and rescued the  side branch compromise first obtuse marginal and 2.0 x 10-mm sprinter  balloon was utilized and angioplasty was performed at low pressure with  excellent results. The wires were withdrawn  angiographically. Guide  catheter pulled out of the body in the usual fashion. Right femoral  arteriography was performed through the arterial access sheath and the  access closed with StarClose with excellent hemostasis. During the  procedure, intracoronary nitroglycerine was also administered.  Cody [name]. Cody Halim, Cody [name]  Electronically Signed  Op Report 2005 OPERATION:  1. Coronary artery bypass grafting x4 (left internal mammary artery to the  left anterior descending, saphenous vein graft to diagonal, sequential  saphenous vein graft to first obtuse marginal and fourth obtuse  marginal).  2. Mitral annuloplasty for ischemic mitral regurgitation with a 28-mm  Edwards IMR annuloplasty ring.    Surgical History:  Past Surgical History  Procedure Laterality Date  . Coronary artery bypass graft  06/2003    x 5, valve repair  . Cardiac catheterization  2009    PCI of left circumflex/ PTCA of an OMI  . Back surgery  1993     Home Meds: Prior to Admission medications   Medication Sig Start Date End Date Taking? Authorizing Provider  albuterol (PROVENTIL HFA;VENTOLIN HFA) 108 (90 BASE)  MCG/ACT inhaler Inhale 2 puffs into the lungs every 6 (six) hours as needed for wheezing. 05/26/12  Yes Antonieta Iba, Cody [name]  amLODipine (NORVASC) 10 MG tablet Take 1 tablet (10 mg total) by mouth daily. 07/08/12  Yes Antonieta Iba, Cody [name]  aspirin 81 MG EC tablet Take 81 mg by mouth daily.     Yes Historical Provider, Cody [name]  carvedilol (COREG) 25 MG tablet Take 12.5 mg by mouth daily. 08/10/12  Yes Antonieta Iba, Cody [name]  furosemide (LASIX) 20 MG tablet Take 1 tablet (20 mg total) by mouth daily as needed. 07/14/11  Yes Antonieta Iba, Cody [name]  naproxen sodium (ANAPROX) 220 MG tablet Take 440 mg by mouth 2 (two) times daily with a meal.   Yes Historical Provider, Cody [name]  nitroGLYCERIN (NITROSTAT) 0.4 MG SL tablet Place 1 tablet (0.4 mg total) under the tongue every 5 (five) minutes as needed for chest pain. 05/26/12  Yes Antonieta Iba, Cody [name]  Omeprazole 20 MG TBEC Take 20 mg by mouth daily. Take 30-60 min before first meal of the day 05/26/12  Yes Nyoka Cowden, Cody [name]  simvastatin (ZOCOR) 40 MG tablet Take 40 mg by mouth every evening.   Yes Historical Provider, Cody [name]    Allergies:  Allergies  Allergen Reactions  . Codeine     History   Social History  . Marital Status: Married    Spouse Name: N/A    Number of Children: 2  . Years of Education: N/A   Occupational History  . Retired     Loss adjuster, chartered for [employer]   Social History Main Topics  . Smoking status: Former Smoker -- 1.00 packs/day for 20 years    Types: Cigarettes    Quit date: 06/12/1965  . Smokeless tobacco: Never Used  . Alcohol Use: No  . Drug Use: No  . Sexually Active: Not on file   Other Topics Concern  . Not on file   Social History Narrative  . No narrative on file     Family History  Problem Relation Age of Onset  . Heart attack Father   . Thyroid disease Other   . Hyperlipidemia Other   . Bone cancer Mother     Review of Systems General: negative for chills, fever Cardiovascular: see above Dermatological: negative for rash Respiratory: +occasional cough that he typically takes mucinex for Urologic: negative for hematuria Abdominal: negative for nausea, vomiting, diarrhea, bright red blood per rectum, melena, or hematemesis Neurologic: negative for visual changes, syncope, or dizziness All other systems reviewed and are otherwise negative except as noted above.  Labs:   Lab Results  Component Value Date   WBC 11.7* 10/21/2012   HGB 15.6 10/21/2012   HCT 45.3 10/21/2012   MCV 87.5 10/21/2012   PLT 178 10/21/2012    Recent Labs Lab 10/21/12 0944  NA 139  K 4.0  CL 105  CO2 24  BUN 24*  CREATININE 1.57*  CALCIUM 9.0  PROT 6.8  BILITOT 0.7  ALKPHOS 68  ALT 6  AST 15  GLUCOSE 94    Recent Labs  10/21/12 0944  TROPONINI <0.30   Lab Results  Component Value Date   CHOL 148 03/22/2009   HDL 47 01/15/2011    LDLCALC 70 01/15/2011   TRIG 125 01/15/2011    Radiology/Studies:  Dg Chest 2 View 10/21/2012   *RADIOLOGY REPORT*  Clinical Data: Shortness of breath and chest pain  CHEST - 2 VIEW  Comparison: December 28, 2007  Findings: There is a small left pleural effusion.  There is pulmonary edema.  The patient status post prior CABG.  Cardiac valvular replacement ring is identified.  The heart size is mildly enlarged.  The soft tissues and osseous structures are stable.  IMPRESSION: Congestive heart failure.  Small left pleural effusion.   Original Report Authenticated By: Sherian Rein, M.D.    EKG: NSR 70bpm frequent PVC and slight ST depression inferiorly which was present on prior tracing  Physical Exam: Blood pressure 133/57, pulse 61, temperature 97.6 F (36.4 C), temperature source Oral, resp. rate 22, height 6\' 2"  (1.88 m), weight 204 lb (92.534 kg), SpO2 98.00%. General: Well developed, well nourished WM in no acute distress. Head: Normocephalic, atraumatic, sclera non-icteric, no xanthomas, nares are without discharge.  Neck: Negative for carotid bruits. JVD not elevated. Lungs: Faint crackles L base>R, otherwise diminished BS throughout. Breathing is unlabored. Heart: RRR with S1 S2. No murmurs, rubs, or gallops appreciated. Abdomen: Soft, non-tender, non-distended with normoactive bowel sounds. No hepatomegaly. No rebound/guarding. No obvious abdominal masses. Msk:  Strength and tone appear normal for age. Extremities: No clubbing or cyanosis. No edema.  Distal pedal pulses are 2+ and equal bilaterally. Neuro: Alert and oriented X 3. No focal deficit. No facial asymmetry. Moves all extremities spontaneously. Psych:  Responds to questions appropriately with a normal affect.    ASSESSMENT AND PLAN:  1. Acute on chronic CHF diastolic - reports ++ salt use, counseled regarding this. Significant diuresis after 1 dose of IV Lasix with improvement in symptoms. 2. CAD s/p CABG 2005, stenting 2009,  mitral ring annuoplasty - see Cody [name] comments. 3. 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 Discontinue naproxen.  4. HTN - controlled. 5. Pulmonary fibrosis - needs to f/u with Dr. Sherene Sires for PFTs as previously planned. 6. Tachypalpitations - rare event. Consider outpatient event monitor and if unrevealing, loop.  Signed, Ronie Spies PA-C 10/21/2012, 1:42 PM  Patient seen and examined with Ronie Spies, PA-C. We discussed all aspects of the encounter. I agree with the assessment and plan as stated above. Symptoms related to acute on chronic HF in setting of noncompliance with diet and lasix. Now back to baseline after one dose of IV lasix. Will give second dose of 20 IV lasix with 40 kcl and let him go home. Reinforced need for daily weights and reviewed use of sliding scale diuretics. I have contacted our [place] office and they will bring him in for a f/u visit in the next week or two.   Daniel Bensimhon,Cody [name] 4:06 PM

## 2012-10-21 NOTE — ED Notes (Signed)
Patient states started having shortness of breath and chest pain "that feels like needles" sticking in me.   Patient denies radiation or other symptoms.   Patient states he had CABG 2005, and stents put in again in 2009 per patient.

## 2012-10-22 ENCOUNTER — Other Ambulatory Visit: Payer: Self-pay | Admitting: Cardiovascular Disease

## 2012-10-24 ENCOUNTER — Other Ambulatory Visit: Payer: Self-pay | Admitting: *Deleted

## 2012-10-24 MED ORDER — FUROSEMIDE 20 MG PO TABS: 20.0000 mg | ORAL_TABLET | Freq: Every day | ORAL | Status: DC | PRN

## 2012-10-24 NOTE — Telephone Encounter (Signed)
Refilled Furosemide sent to [facility] pharmacy.

## 2012-12-05 ENCOUNTER — Ambulatory Visit (INDEPENDENT_AMBULATORY_CARE_PROVIDER_SITE_OTHER): Payer: Medicare Other | Admitting: Cardiovascular Disease

## 2012-12-05 ENCOUNTER — Encounter: Payer: Self-pay | Admitting: Cardiovascular Disease

## 2012-12-05 VITALS — BP 90/54 | HR 64 | Ht 74.0 in | Wt 207.5 lb

## 2012-12-05 DIAGNOSIS — R0602 Shortness of breath: Secondary | ICD-10-CM

## 2012-12-05 DIAGNOSIS — I10 Essential (primary) hypertension: Secondary | ICD-10-CM

## 2012-12-05 DIAGNOSIS — E785 Hyperlipidemia, unspecified: Secondary | ICD-10-CM

## 2012-12-05 DIAGNOSIS — I6529 Occlusion and stenosis of unspecified carotid artery: Secondary | ICD-10-CM

## 2012-12-05 DIAGNOSIS — I5033 Acute on chronic diastolic (congestive) heart failure: Secondary | ICD-10-CM

## 2012-12-05 DIAGNOSIS — I509 Heart failure, unspecified: Secondary | ICD-10-CM

## 2012-12-05 DIAGNOSIS — I2510 Atherosclerotic heart disease of native coronary artery without angina pectoris: Secondary | ICD-10-CM

## 2012-12-05 MED ORDER — FUROSEMIDE 20 MG PO TABS: 20.0000 mg | ORAL_TABLET | Freq: Two times a day (BID) | ORAL | Status: DC | PRN

## 2012-12-05 NOTE — Assessment & Plan Note (Signed)
We'll continue aggressive cholesterol management

## 2012-12-05 NOTE — Progress Notes (Signed)
Patient ID: Cody [name], male    DOB: February 25, 1927, 79 y.o.   MRN: [id number]  HPI Comments: 79 year old male with a history of coronary artery disease, bypass surgery in August 15, 2003, peripheral vascular disease with right carotid endarterectomy for 80% lesion, PCI of his left circumflex in 08-15-07 and PTCA of an OM1 at the same time, history of hypertension, hyperlipidemia who presents for followup. He has a long history of exposure to asphalt, previous smoking history (smoked for 25 years,stopped in Aug 14, 1965).  daughter  passed away from brain cancer.he was told by cardiothoracic surgeon at the time of his bypass that he had severe "black lungs".  pneumonia in December 2012 and in November 2013.  In the past he has had mild chronic shortness of breath. not on inhalers on a regular basis. On his last clinic visit, he reported worsening shortness of breath. There was concern of ischemia, worsening underlying lung pathology. He was started on high dose Lasix for several days. He was seen by pulmonary on the same day as his cardiology visit with recommendations made. He did not followup with PFTs secondary to bad weather. He felt better after taking Lasix twice a day for several days. In the past he has used significant salt on his food  He presents today and reports that he had a recent trip to the emergency room at [facility] in [place] for shortness of breath. Prior to this he was only taking his Lasix very sparingly, 20 mg. He was given IV Lasix, had significant urination and felt better and was discharged home later that evening. He was discharged home on Lasix 40 mg daily. He reports that he's been taking Lasix 40 mg alternating with 20 mg every other day. He had significant dizziness this morning. Denies any shortness of breath, no edema. Has not been watching his weight closely at home.   Previous Echocardiogram showed normal systolic function with mildly elevated right ventricular systolic pressures.    Cardiac cath November 28 2007 showed that the LIMA to the LAD was patent.  The saphenous vein graft to the diagonal was patent.  The right coronary artery was nondominant.  The circumflex had a 90% stenosis of the takeoff of OM1.  The takeoff of the OM2 had a subtotal occlusion. The jump graft between the OM2 and distal circumflex were patent.  He underwent successful PTCA and stenting of the circumflex and a PTCA of the OM1.    Carotid ultrasound in November 2011 showing 40-59% carotid disease on the left    EKG  shows normal sinus rhythm with rate 64  beats per minute, Right bundle branch block, no significant ST or T wave changes, PVCs noted also with APCs    Outpatient Encounter Prescriptions as of 12/05/2012  Medication Sig Dispense Refill  . albuterol (PROVENTIL HFA;VENTOLIN HFA) 108 (90 BASE) MCG/ACT inhaler Inhale 2 puffs into the lungs every 6 (six) hours as needed for wheezing.  1 Inhaler  2  . amLODipine (NORVASC) 10 MG tablet Take 1 tablet (10 mg total) by mouth daily.  90 tablet  3  . aspirin 81 MG EC tablet Take 81 mg by mouth daily.        . carvedilol (COREG) 25 MG tablet Take 12.5 mg by mouth daily.      . furosemide (LASIX) 20 MG tablet Take 40 mg by mouth daily.      . naproxen sodium (ANAPROX) 220 MG tablet Take 440 mg by mouth 2 (two) times daily  with a meal.      . nitroGLYCERIN (NITROSTAT) 0.4 MG SL tablet Place 1 tablet (0.4 mg total) under the tongue every 5 (five) minutes as needed for chest pain.  25 tablet  3  . Omeprazole 20 MG TBEC Take 20 mg by mouth daily. Take 30-60 min before first meal of the day      . simvastatin (ZOCOR) 40 MG tablet Take 40 mg by mouth every evening.      . [DISCONTINUED] furosemide (LASIX) 20 MG tablet Take 1 tablet (20 mg total) by mouth daily as needed.  90 tablet  3   No facility-administered encounter medications on file as of 12/05/2012.    Review of Systems  Constitutional: Negative.   HENT: Negative.   Eyes: Negative.    Respiratory:       Chronic  Cardiovascular: Negative.   Gastrointestinal: Negative.   Endocrine: Negative.   Musculoskeletal: Negative.   Skin: Negative.   Allergic/Immunologic: Negative.   Neurological: Negative.   Hematological: Negative.   Psychiatric/Behavioral: Negative.   All other systems reviewed and are negative.   BP 90/54  Pulse 64  Ht 6\' 2"  (1.88 m)  Wt 207 lb 8 oz (94.121 kg)  BMI 26.63 kg/m2 who  Physical Exam  Nursing note and vitals reviewed. Constitutional: He is oriented to person, place, and time. He appears well-developed and well-nourished.  HENT:  Head: Normocephalic.  Nose: Nose normal.  Mouth/Throat: Oropharynx is clear and moist.  Eyes: Conjunctivae are normal. Pupils are equal, round, and reactive to light.  Neck: Normal range of motion. Neck supple. No JVD present.  Cardiovascular: Normal rate, regular rhythm, S1 normal, S2 normal, normal heart sounds and intact distal pulses.  Exam reveals no gallop and no friction rub.   No murmur heard. Pulmonary/Chest: Effort normal. No respiratory distress. He has decreased breath sounds in the right lower field and the left lower field. He has no wheezes. He has rales. He exhibits no tenderness.  Rales at the bases bilaterally  Abdominal: Soft. Bowel sounds are normal. He exhibits no distension. There is no tenderness.  Musculoskeletal: Normal range of motion. He exhibits no edema and no tenderness.  Lymphadenopathy:    He has no cervical adenopathy.  Neurological: He is alert and oriented to person, place, and time. Coordination normal.  Skin: Skin is warm and dry. No rash noted. No erythema.  Psychiatric: He has a normal mood and affect. His behavior is normal. Judgment and thought content normal.      Assessment and Plan

## 2012-12-05 NOTE — Assessment & Plan Note (Signed)
Recent shortness of breath episodes likely from underlying lung disease in the setting of mild diastolic CHF. Improved with Lasix. He does not have regular followup with pulmonary. He uses inhaler rarely. If she starts to have worsening symptoms despite diuresis, he would need to be set up with pulmonary again.

## 2012-12-05 NOTE — Assessment & Plan Note (Signed)
Recommended he stay on his simvastatin 

## 2012-12-05 NOTE — Assessment & Plan Note (Signed)
Blood pressure running low. Suggested he cut his amlodipine in half and take 5 mg daily

## 2012-12-05 NOTE — Assessment & Plan Note (Signed)
We have encouraged him to take at least Lasix 20 mg daily. Given his low blood pressure today, I'm concerned about dehydration. Blood work done on 10/21/2012 showed mildly elevated creatinine and BUN. We will need to proceed cautiously with his diuretic regimen. I've asked him to watch his fluid intake, salt intake, write down his weights daily. He should take extra Lasix, 40 mg, for any weight gain more than 3 pounds.

## 2012-12-05 NOTE — Patient Instructions (Addendum)
You are doing well. Please cut the amlodipine in 1/2 once a day  Please take lasix 20 mg a day  Monitor your weight, take extra lasix 20 mg for weight gain more than 3 pounds  If you have severe shortness of breath, take several lasix at one time.  Please call us if you have new issues that need to be addressed before your next appt.  Your physician wants you to follow-up in: 3 months.  You will receive a reminder letter in the mail two months in advance. If you don't receive a letter, please call our office to schedule the follow-up appointment.

## 2013-03-10 ENCOUNTER — Inpatient Hospital Stay (HOSPITAL_COMMUNITY)
Admission: EM | Admit: 2013-03-10 | Discharge: 2013-03-14 | DRG: 189 | Disposition: A | Discharge status: Home / Self Care | Payer: Medicare Other | Attending: Internal Medicine | Admitting: Internal Medicine

## 2013-03-10 ENCOUNTER — Encounter (HOSPITAL_COMMUNITY): Payer: Self-pay | Admitting: Emergency Medicine

## 2013-03-10 ENCOUNTER — Emergency Department (HOSPITAL_COMMUNITY): Payer: Medicare Other

## 2013-03-10 DIAGNOSIS — Z87891 Personal history of nicotine dependence: Secondary | ICD-10-CM

## 2013-03-10 DIAGNOSIS — N179 Acute kidney failure, unspecified: Secondary | ICD-10-CM | POA: Diagnosis present

## 2013-03-10 DIAGNOSIS — R0602 Shortness of breath: Secondary | ICD-10-CM

## 2013-03-10 DIAGNOSIS — R609 Edema, unspecified: Secondary | ICD-10-CM

## 2013-03-10 DIAGNOSIS — D72829 Elevated white blood cell count, unspecified: Secondary | ICD-10-CM | POA: Diagnosis present

## 2013-03-10 DIAGNOSIS — I5033 Acute on chronic diastolic (congestive) heart failure: Secondary | ICD-10-CM

## 2013-03-10 DIAGNOSIS — I129 Hypertensive chronic kidney disease with stage 1 through stage 4 chronic kidney disease, or unspecified chronic kidney disease: Secondary | ICD-10-CM | POA: Diagnosis present

## 2013-03-10 DIAGNOSIS — I5032 Chronic diastolic (congestive) heart failure: Secondary | ICD-10-CM | POA: Diagnosis present

## 2013-03-10 DIAGNOSIS — E785 Hyperlipidemia, unspecified: Secondary | ICD-10-CM | POA: Diagnosis present

## 2013-03-10 DIAGNOSIS — I4510 Unspecified right bundle-branch block: Secondary | ICD-10-CM | POA: Diagnosis present

## 2013-03-10 DIAGNOSIS — Z9861 Coronary angioplasty status: Secondary | ICD-10-CM

## 2013-03-10 DIAGNOSIS — Z951 Presence of aortocoronary bypass graft: Secondary | ICD-10-CM

## 2013-03-10 DIAGNOSIS — I10 Essential (primary) hypertension: Secondary | ICD-10-CM

## 2013-03-10 DIAGNOSIS — J189 Pneumonia, unspecified organism: Secondary | ICD-10-CM

## 2013-03-10 DIAGNOSIS — N183 Chronic kidney disease, stage 3 unspecified: Secondary | ICD-10-CM | POA: Diagnosis present

## 2013-03-10 DIAGNOSIS — I2510 Atherosclerotic heart disease of native coronary artery without angina pectoris: Secondary | ICD-10-CM | POA: Diagnosis present

## 2013-03-10 DIAGNOSIS — Z7982 Long term (current) use of aspirin: Secondary | ICD-10-CM

## 2013-03-10 DIAGNOSIS — I4892 Unspecified atrial flutter: Secondary | ICD-10-CM | POA: Diagnosis present

## 2013-03-10 DIAGNOSIS — J209 Acute bronchitis, unspecified: Secondary | ICD-10-CM | POA: Diagnosis present

## 2013-03-10 DIAGNOSIS — I509 Heart failure, unspecified: Secondary | ICD-10-CM | POA: Diagnosis present

## 2013-03-10 DIAGNOSIS — Z79899 Other long term (current) drug therapy: Secondary | ICD-10-CM

## 2013-03-10 DIAGNOSIS — J9600 Acute respiratory failure, unspecified whether with hypoxia or hypercapnia: Principal | ICD-10-CM | POA: Diagnosis present

## 2013-03-10 DIAGNOSIS — J8410 Pulmonary fibrosis, unspecified: Secondary | ICD-10-CM | POA: Diagnosis present

## 2013-03-10 LAB — BASIC METABOLIC PANEL
BUN: 29 mg/dL — ABNORMAL HIGH (ref 6–23)
CO2: 27 mEq/L (ref 19–32)
Calcium: 9.3 mg/dL (ref 8.4–10.5)
GFR calc non Af Amer: 34 mL/min — ABNORMAL LOW (ref >90)
Glucose, Bld: 103 mg/dL — ABNORMAL HIGH (ref 70–99)
Sodium: 138 mEq/L (ref 135–145)

## 2013-03-10 LAB — CBC
HCT: 50.5 % (ref 39.0–52.0)
Hemoglobin: 17.3 g/dL — ABNORMAL HIGH (ref 13.0–17.0)
MCH: 30.8 pg (ref 26.0–34.0)
MCHC: 34.3 g/dL (ref 30.0–36.0)
RBC: 5.61 MIL/uL (ref 4.22–5.81)

## 2013-03-10 LAB — TROPONIN I: Troponin I: <0.3 ng/mL (ref <0.30)

## 2013-03-10 MED ORDER — SIMVASTATIN 40 MG PO TABS: 40.0000 mg | ORAL_TABLET | Freq: Every evening | ORAL | Status: DC

## 2013-03-10 MED ORDER — AMLODIPINE BESYLATE 10 MG PO TABS
10.0000 mg | ORAL_TABLET | Freq: Every day | ORAL | Status: DC
  Administered 2013-03-10 – 2013-03-14 (×5): 10 mg via ORAL
  Filled 2013-03-10 (×5): qty 1

## 2013-03-10 MED ORDER — SODIUM CHLORIDE 0.9 % IJ SOLN
3.0000 mL | Freq: Two times a day (BID) | INTRAMUSCULAR | Status: DC
  Administered 2013-03-10 – 2013-03-14 (×8): 3 mL via INTRAVENOUS

## 2013-03-10 MED ORDER — DEXTROSE 5 % IV SOLN
500.0000 mg | INTRAVENOUS | Status: DC
  Administered 2013-03-10 – 2013-03-11 (×2): 500 mg via INTRAVENOUS
  Filled 2013-03-10 (×3): qty 500

## 2013-03-10 MED ORDER — PANTOPRAZOLE SODIUM 40 MG PO TBEC
40.0000 mg | DELAYED_RELEASE_TABLET | Freq: Every day | ORAL | Status: DC
  Administered 2013-03-11 – 2013-03-14 (×4): 40 mg via ORAL
  Filled 2013-03-10 (×4): qty 1

## 2013-03-10 MED ORDER — ONDANSETRON HCL 4 MG PO TABS: 4.0000 mg | ORAL_TABLET | Freq: Four times a day (QID) | ORAL | Status: DC | PRN

## 2013-03-10 MED ORDER — METHYLPREDNISOLONE SODIUM SUCC 125 MG IJ SOLR
80.0000 mg | Freq: Two times a day (BID) | INTRAMUSCULAR | Status: DC
  Administered 2013-03-10 – 2013-03-13 (×7): 80 mg via INTRAVENOUS
  Filled 2013-03-10: qty 2
  Filled 2013-03-10 (×10): qty 1.28

## 2013-03-10 MED ORDER — ALBUTEROL SULFATE (5 MG/ML) 0.5% IN NEBU
5.0000 mg | INHALATION_SOLUTION | Freq: Once | RESPIRATORY_TRACT | Status: AC
  Administered 2013-03-10: 5 mg via RESPIRATORY_TRACT
  Filled 2013-03-10: qty 1

## 2013-03-10 MED ORDER — ALBUTEROL SULFATE (5 MG/ML) 0.5% IN NEBU: 5.0000 mg | INHALATION_SOLUTION | RESPIRATORY_TRACT | Status: DC | PRN

## 2013-03-10 MED ORDER — HEPARIN SODIUM (PORCINE) 5000 UNIT/ML IJ SOLN
5000.0000 [IU] | Freq: Three times a day (TID) | INTRAMUSCULAR | Status: DC
  Administered 2013-03-10 – 2013-03-14 (×11): 5000 [IU] via SUBCUTANEOUS
  Filled 2013-03-10 (×14): qty 1

## 2013-03-10 MED ORDER — CARVEDILOL 12.5 MG PO TABS
12.5000 mg | ORAL_TABLET | Freq: Every day | ORAL | Status: DC
  Administered 2013-03-11 – 2013-03-14 (×4): 12.5 mg via ORAL
  Filled 2013-03-10 (×4): qty 1

## 2013-03-10 MED ORDER — DEXTROSE 5 % IV SOLN: 500.0000 mg | INTRAVENOUS | Status: DC

## 2013-03-10 MED ORDER — SODIUM CHLORIDE 0.9 % IJ SOLN: 3.0000 mL | INTRAMUSCULAR | Status: DC | PRN

## 2013-03-10 MED ORDER — POLYETHYLENE GLYCOL 3350 17 G PO PACK
17.0000 g | PACK | Freq: Every day | ORAL | Status: DC | PRN
  Filled 2013-03-10: qty 1

## 2013-03-10 MED ORDER — FUROSEMIDE 10 MG/ML IJ SOLN
40.0000 mg | Freq: Once | INTRAMUSCULAR | Status: AC
  Administered 2013-03-10: 40 mg via INTRAVENOUS
  Filled 2013-03-10: qty 4

## 2013-03-10 MED ORDER — ACETAMINOPHEN 650 MG RE SUPP: 650.0000 mg | Freq: Four times a day (QID) | RECTAL | Status: DC | PRN

## 2013-03-10 MED ORDER — ALBUTEROL SULFATE (5 MG/ML) 0.5% IN NEBU
2.5000 mg | INHALATION_SOLUTION | Freq: Four times a day (QID) | RESPIRATORY_TRACT | Status: DC
  Administered 2013-03-10: 22:00:00 2.5 mg via RESPIRATORY_TRACT
  Filled 2013-03-10: qty 0.5

## 2013-03-10 MED ORDER — MENTHOL 3 MG MT LOZG
1.0000 | LOZENGE | Freq: Every day | OROMUCOSAL | Status: DC | PRN
  Filled 2013-03-10: qty 9

## 2013-03-10 MED ORDER — ONDANSETRON HCL 4 MG/2ML IJ SOLN: 4.0000 mg | Freq: Four times a day (QID) | INTRAMUSCULAR | Status: DC | PRN

## 2013-03-10 MED ORDER — ATORVASTATIN CALCIUM 20 MG PO TABS
20.0000 mg | ORAL_TABLET | Freq: Every day | ORAL | Status: DC
  Administered 2013-03-10 – 2013-03-13 (×4): 20 mg via ORAL
  Filled 2013-03-10 (×5): qty 1

## 2013-03-10 MED ORDER — DEXTROSE 5 % IV SOLN
1.0000 g | Freq: Once | INTRAVENOUS | Status: AC
  Administered 2013-03-10: 1 g via INTRAVENOUS
  Filled 2013-03-10: qty 10

## 2013-03-10 MED ORDER — SODIUM CHLORIDE 0.9 % IV SOLN
250.0000 mL | INTRAVENOUS | Status: DC | PRN
  Administered 2013-03-12: 20 mL via INTRAVENOUS

## 2013-03-10 MED ORDER — FUROSEMIDE 20 MG PO TABS
20.0000 mg | ORAL_TABLET | Freq: Every day | ORAL | Status: DC
  Administered 2013-03-11: 20 mg via ORAL
  Filled 2013-03-10: qty 1

## 2013-03-10 MED ORDER — ASPIRIN 81 MG PO CHEW
81.0000 mg | CHEWABLE_TABLET | Freq: Every day | ORAL | Status: DC
  Administered 2013-03-10 – 2013-03-14 (×5): 81 mg via ORAL
  Filled 2013-03-10 (×6): qty 1

## 2013-03-10 MED ORDER — GUAIFENESIN ER 600 MG PO TB12
600.0000 mg | ORAL_TABLET | Freq: Two times a day (BID) | ORAL | Status: DC
  Administered 2013-03-10 – 2013-03-14 (×8): 600 mg via ORAL
  Filled 2013-03-10 (×9): qty 1

## 2013-03-10 MED ORDER — OSELTAMIVIR PHOSPHATE 75 MG PO CAPS
75.0000 mg | ORAL_CAPSULE | Freq: Two times a day (BID) | ORAL | Status: DC
  Administered 2013-03-10 – 2013-03-11 (×2): 75 mg via ORAL
  Filled 2013-03-10 (×3): qty 1

## 2013-03-10 MED ORDER — IPRATROPIUM BROMIDE 0.02 % IN SOLN
0.5000 mg | Freq: Once | RESPIRATORY_TRACT | Status: AC
  Administered 2013-03-10: 0.5 mg via RESPIRATORY_TRACT
  Filled 2013-03-10: qty 2.5

## 2013-03-10 MED ORDER — ONDANSETRON HCL 4 MG/2ML IJ SOLN: 4.0000 mg | Freq: Three times a day (TID) | INTRAMUSCULAR | Status: DC | PRN

## 2013-03-10 MED ORDER — ACETAMINOPHEN 325 MG PO TABS: 650.0000 mg | ORAL_TABLET | Freq: Four times a day (QID) | ORAL | Status: DC | PRN

## 2013-03-10 MED ORDER — DEXTROSE 5 % IV SOLN
1.0000 g | INTRAVENOUS | Status: DC
  Administered 2013-03-11 – 2013-03-13 (×3): 1 g via INTRAVENOUS
  Filled 2013-03-10 (×5): qty 10

## 2013-03-10 MED ORDER — ALBUTEROL SULFATE (5 MG/ML) 0.5% IN NEBU: 2.5000 mg | INHALATION_SOLUTION | RESPIRATORY_TRACT | Status: DC | PRN

## 2013-03-10 NOTE — ED Notes (Signed)
Called lab able to add Troponin with available blood.

## 2013-03-10 NOTE — ED Notes (Signed)
Pt c/o SOB ongoing x 5 weeks that is getting progressively worse.  Pt states he becomes SOB and weak and his legs are giving out on him.  Pt has a cough in triage and states it is productive with grey colored sputum.  Pt's wife states he's had pneumonia every year for 3 years during this time of year.

## 2013-03-10 NOTE — ED Notes (Signed)
Spoke with Provider will order blood cultures prior to antibiotic administration.

## 2013-03-10 NOTE — ED Notes (Signed)
Phlebology at bedside to draw blood cultures.

## 2013-03-10 NOTE — ED Provider Notes (Signed)
CSN: [id number]     Arrival date & time 03/10/13  1114 History   First MD Initiated Contact with Patient 03/10/13 1504     Chief Complaint  Patient presents with  . Shortness of Breath  . Weakness   (Consider location/radiation/quality/duration/timing/severity/associated sxs/prior Treatment) Patient is a 79 y.o. male presenting with shortness of breath and weakness. The history is provided by the patient and medical records.  Shortness of Breath Associated symptoms: cough   Weakness Associated symptoms include coughing and weakness.   This is an 79 year old male with past medical history significant for CAD, CHF, pulmonary fibrosis, hypertension, hyperlipidemia presenting to the ED for worsening shortness of breath over the past 5 weeks. Patient states he is able to take a maximum of approximately 5 steps before becoming severely short of breath and having to stop. He does not experience chest pain with exertion. Over the past week patient has developed a cough with gray-colored sputum. Denies any fevers, sweats, or chills.  Pt not on home O2.  Pt does daily weights for monitoring of CHF, no significant change in the past several weeks.  No peripheral leg edema and has been taking Lasix as directed.  Pt is followed regularly by cardiology-  Dr. Mariah Milling.  Past Medical History  Diagnosis Date  . Hyperlipidemia   . Hypertension   . Coronary artery disease     a. CABG 2005. b. 2009: PTCA/atherotomy->LPDA, stent to prox-mid LCx, & PTCA of stent jailed OM1.  . Carotid artery disease     a. Prior R CEA. b. Parent RICA s/p CEA with patch angioplasty, 40-59% LICA - f/u recommended 01/2013  . Congestive heart failure     a. EF 40% prior to CABG. b. EF 50-55% in 2012.  . Mitral regurgitation     a. Ischemic MR s/p mitral ring annuloplasty 2005 at time of CABG.  . Pulmonary fibrosis     Previous tobacco history & asphalt exposure, was told he had severe "black lungs" by cardiothoracic surgeon at  time of CABG. Was supposed to have PFTs 05/2012 by pulm but missed due to bad weather.  Marland Kitchen RBBB    Past Surgical History  Procedure Laterality Date  . Coronary artery bypass graft  06/2003    x 4, valve repair  . Cardiac catheterization  2009    PCI of left circumflex/ PTCA of an OMI  . Back surgery  1993  . Mitral annuoplasty     Family History  Problem Relation Age of Onset  . Heart attack Father   . Thyroid disease Other   . Hyperlipidemia Other   . Bone cancer Mother    History  Substance Use Topics  . Smoking status: Former Smoker -- 1.00 packs/day for 20 years    Types: Cigarettes    Quit date: 06/12/1965  . Smokeless tobacco: Never Used  . Alcohol Use: No    Review of Systems  Respiratory: Positive for cough and shortness of breath.   All other systems reviewed and are negative.    Allergies  Codeine  Home Medications   Current Outpatient Rx  Name  Route  Sig  Dispense  Refill  . albuterol (PROVENTIL HFA;VENTOLIN HFA) 108 (90 BASE) MCG/ACT inhaler   Inhalation   Inhale 2 puffs into the lungs every 6 (six) hours as needed for wheezing.   1 Inhaler   2   . amLODipine (NORVASC) 10 MG tablet   Oral   Take 1 tablet (10 mg total) by  mouth daily.   90 tablet   3   . aspirin 81 MG EC tablet   Oral   Take 81 mg by mouth daily.           . carvedilol (COREG) 25 MG tablet   Oral   Take 12.5 mg by mouth daily.         . furosemide (LASIX) 20 MG tablet   Oral   Take 1 tablet (20 mg total) by mouth 2 (two) times daily as needed.   60 tablet   6   . guaiFENesin (MUCINEX) 600 MG 12 hr tablet   Oral   Take 600 mg by mouth 2 (two) times daily.         Marland Kitchen menthol-cetylpyridinium (CEPACOL) 3 MG lozenge   Oral   Take 1 lozenge by mouth daily as needed for sore throat.         . naproxen sodium (ANAPROX) 220 MG tablet   Oral   Take 440 mg by mouth 2 (two) times daily with a meal.         . nitroGLYCERIN (NITROSTAT) 0.4 MG SL tablet   Sublingual    Place 1 tablet (0.4 mg total) under the tongue every 5 (five) minutes as needed for chest pain.   25 tablet   3   . Omeprazole 20 MG TBEC   Oral   Take 20 mg by mouth daily. Take 30-60 min before first meal of the day         . simvastatin (ZOCOR) 40 MG tablet   Oral   Take 40 mg by mouth every evening.          BP 142/67  Pulse 65  Temp(Src) 97.4 F (36.3 C) (Oral)  Resp 26  SpO2 95%  Physical Exam  Nursing note and vitals reviewed. Constitutional: He is oriented to person, place, and time. He appears well-developed and well-nourished. No distress.  HENT:  Head: Normocephalic and atraumatic.  Mouth/Throat: Uvula is midline, oropharynx is clear and moist and mucous membranes are normal.  Eyes: Conjunctivae and EOM are normal. Pupils are equal, round, and reactive to light.  Neck: Normal range of motion.  Cardiovascular: Normal rate, regular rhythm and normal heart sounds.   Pulmonary/Chest: Effort normal. No respiratory distress. He has wheezes.  Increased work of breathing without accessory muscle use; significant wheezes in right lung fields  Abdominal: Soft. Bowel sounds are normal. There is no tenderness. There is no guarding.  Musculoskeletal: Normal range of motion. He exhibits no edema.  Neurological: He is alert and oriented to person, place, and time.  Skin: Skin is warm. He is not diaphoretic.  Psychiatric: He has a normal mood and affect.    ED Course  Procedures (including critical care time) Labs Review Labs Reviewed  CBC - Abnormal; Notable for the following:    WBC 11.6 (*)    Hemoglobin 17.3 (*)    All other components within normal limits  BASIC METABOLIC PANEL - Abnormal; Notable for the following:    Glucose, Bld 103 (*)    BUN 29 (*)    Creatinine, Ser 1.77 (*)    GFR calc non Af Amer 34 (*)    GFR calc Af Amer 39 (*)    All other components within normal limits  PRO B NATRIURETIC PEPTIDE - Abnormal; Notable for the following:    Pro B  Natriuretic peptide (BNP) 2108.0 (*)    All other components within normal limits  TROPONIN I   Imaging Review Dg Chest 2 View  03/10/2013   CLINICAL DATA:  Shortness of breath, weakness, hypertension, prior coronary bypass and mitral valve replacement  EXAM: CHEST  2 VIEW  COMPARISON:  10/21/2012  FINDINGS: Prior coronary bypass and mitral valve replacement noted. Heart is enlarged. Diffuse asymmetric patchy airspace process with small effusions. Pattern is nonspecific the can be seen with asymmetric edema related CHF versus pneumonia. No pneumothorax. Trachea is midline. Atherosclerosis of the aorta.  IMPRESSION: Bilateral asymmetric airspace process versus edema with small effusions.   Electronically Signed   By: Ruel Favors M.D.   On: 03/10/2013 12:24    EKG Interpretation    Date/Time:  Friday March 10 2013 15:36:56 EST Ventricular Rate:  65 PR Interval:    QRS Duration: 136 QT Interval:  461 QTC Calculation: 479 R Axis:   73 Text Interpretation:  Age not entered, assumed to be  79 years old for purpose of ECG interpretation Atrial flutter with varied AV block, Right bundle branch block Baseline wander in lead(s) II III aVF Confirmed by GOLDSTON  MD, SCOTT ([id number]) on 03/10/2013 3:46:43 PM            MDM   1. Acute on chronic diastolic CHF (congestive heart failure), NYHA class 2   2. CAP (community acquired pneumonia)   3. Shortness of breath   4. Postinflammatory pulmonary fibrosis   5. Essential hypertension   6. HYPERLIPIDEMIA-MIXED   7. CAD, UNSPECIFIED SITE   8. Acute bronchitis   9. Acute respiratory failure   10. Atrial flutter   11. Chronic diastolic heart failure, NYHA class 2    EKG atrial flutter with RBBB.  Trop negative.  BNP elevated at ~2100, however pt does not appear significantly fluid overloaded.  CXR with questionable bilateral airspace disease vs pulmonary edema.  Pt likely with acute CHF exacerbation as well was CAP, now with new O2  requirement.  Pt started on Rocephin and azithromycin in the ED, as well as additional dose of lasix-- will require hospital admission.  Consulted hospitalist. Dr. Robb Matar-- pt to be admitted to [facility] triad team 10, telemetry.  Temp admit orders placed.  VS stable for transfer to floor.  Garlon Hatchet, PA-C 03/10/13 [phone]

## 2013-03-10 NOTE — H&P (Addendum)
[facility] History and Physical  Cody [name] YNW:[id number] DOB: [birth year] DOA: 03/10/2013  Referring physician: Dr. Criss Alvine PCP: Clydell Hakim, MD   Chief Complaint: SOB  HPI: Cody [name] is a 79 y.o. male  Past medical history of coronary artery disease status post CABG in 2005, with stenting in 2009, mitral annuloplasty, hypertension pulmonary fibrosis and diastolic heart failure who comes to the [facility] emergency department for shortness of breath that started 3 weeks prior to admission. He relates that at baseline he doesn't use oxygen he could walk about 100 feet without getting short of breath. But now he can he would walk to the bathroom without being short of breath. He relates a new productive cough that is change in color and is thicker. No fevers at home not contacts, he's had his flu and pneumonia vaccine. He relates he wakes up in the middle of the night short of breath so we were consulted for further evaluation.  In the ED: When he got to the ED his saturations were below 88%, he was put on 2 L of oxygen and he improved about 90%. A CBC was done and shows a mild white count with left shift, basic metabolic panel was done this shows a creatinine of 1.7 close to baseline (baseline creatinine 1.5). Chest x-ray was done that showed asymmetric infiltrates. He was started on Rocephin and Cipro in the emergency room.   Review of Systems:  Constitutional:  No weight loss, night sweats, Fevers, chills, fatigue.  HEENT:  No headaches, Difficulty swallowing,Tooth/dental problems,Sore throat,  No sneezing, itching, ear ache, nasal congestion, post nasal drip,  Cardio-vascular:  No chest pain, swelling in lower extremities, anasarca, dizziness, palpitations  GI:  No heartburn, indigestion, abdominal pain, nausea, vomiting, diarrhea, change in bowel habits, loss of appetite  Resp:  No shortness of breath with exertion or at rest. No coughing up of blood.No  change in color of mucus.No wheezing.No chest wall deformity  Skin:  no rash or lesions.  GU:  no dysuria, change in color of urine, no urgency or frequency. No flank pain.  Musculoskeletal:  No joint pain or swelling. No decreased range of motion. No back pain.  Psych:  No change in mood or affect. No depression or anxiety. No memory loss.   Past Medical History  Diagnosis Date  . Hyperlipidemia   . Hypertension   . Coronary artery disease     a. CABG 2005. b. 2009: PTCA/atherotomy->LPDA, stent to prox-mid LCx, & PTCA of stent jailed OM1.  . Carotid artery disease     a. Prior R CEA. b. Parent RICA s/p CEA with patch angioplasty, 40-59% LICA - f/u recommended 01/2013  . Congestive heart failure     a. EF 40% prior to CABG. b. EF 50-55% in 2012.  . Mitral regurgitation     a. Ischemic MR s/p mitral ring annuloplasty 2005 at time of CABG.  . Pulmonary fibrosis     Previous tobacco history & asphalt exposure, was told he had severe "black lungs" by cardiothoracic surgeon at time of CABG. Was supposed to have PFTs 05/2012 by pulm but missed due to bad weather.  Marland Kitchen RBBB    Past Surgical History  Procedure Laterality Date  . Coronary artery bypass graft  06/2003    x 4, valve repair  . Cardiac catheterization  2009    PCI of left circumflex/ PTCA of an OMI  . Back surgery  1993  . Mitral annuoplasty  Social History:  reports that he quit smoking about 47 years ago. His smoking use included Cigarettes. He has a 20 pack-year smoking history. He has never used smokeless tobacco. He reports that he does not drink alcohol or use illicit drugs.  Allergies  Allergen Reactions  . Codeine Anxiety    Family History  Problem Relation Age of Onset  . Heart attack Father   . Thyroid disease Other   . Hyperlipidemia Other   . Bone cancer Mother      Prior to Admission medications   Medication Sig Start Date End Date Taking? Authorizing Provider  albuterol (PROVENTIL HFA;VENTOLIN HFA)  108 (90 BASE) MCG/ACT inhaler Inhale 2 puffs into the lungs every 6 (six) hours as needed for wheezing. 05/26/12  Yes Antonieta Iba, MD  amLODipine (NORVASC) 10 MG tablet Take 1 tablet (10 mg total) by mouth daily. 07/08/12  Yes Antonieta Iba, MD  aspirin 81 MG EC tablet Take 81 mg by mouth daily.     Yes Historical Provider, MD  carvedilol (COREG) 25 MG tablet Take 12.5 mg by mouth daily. 08/10/12  Yes Antonieta Iba, MD  furosemide (LASIX) 20 MG tablet Take 1 tablet (20 mg total) by mouth 2 (two) times daily as needed. 12/05/12  Yes Antonieta Iba, MD  guaiFENesin (MUCINEX) 600 MG 12 hr tablet Take 600 mg by mouth 2 (two) times daily.   Yes Historical Provider, MD  menthol-cetylpyridinium (CEPACOL) 3 MG lozenge Take 1 lozenge by mouth daily as needed for sore throat.   Yes Historical Provider, MD  naproxen sodium (ANAPROX) 220 MG tablet Take 440 mg by mouth 2 (two) times daily with a meal.   Yes Historical Provider, MD  nitroGLYCERIN (NITROSTAT) 0.4 MG SL tablet Place 1 tablet (0.4 mg total) under the tongue every 5 (five) minutes as needed for chest pain. 05/26/12  Yes Antonieta Iba, MD  Omeprazole 20 MG TBEC Take 20 mg by mouth daily. Take 30-60 min before first meal of the day 05/26/12  Yes Nyoka Cowden, MD  simvastatin (ZOCOR) 40 MG tablet Take 40 mg by mouth every evening.   Yes Historical Provider, MD   Physical Exam: Filed Vitals:   03/10/13 1600  BP: 113/56  Pulse: 68  Resp: 22    BP 113/56  Pulse 68  Resp 22  SpO2 100%  General:  Appears calm and comfortable Eyes: PERRL, normal lids, irises & conjunctiva ENT: grossly normal hearing, lips & tongue Neck: no LAD, masses or thyromegaly, or JVD Cardiovascular: RRR, no m/r/g. No LE edema. Telemetry: SR, no arrhythmias  Respiratory: Good air movement with crackles and wheezing bilaterally. Abdomen: soft, ntnd Skin: no rash or induration seen on limited exam Musculoskeletal: grossly normal tone BUE/BLE Psychiatric:  grossly normal mood and affect, speech fluent and appropriate Neurologic: grossly non-focal.          Labs on Admission:  Basic Metabolic Panel:  Recent Labs Lab 03/10/13 1325  NA 138  K 4.7  CL 100  CO2 27  GLUCOSE 103*  BUN 29*  CREATININE 1.77*  CALCIUM 9.3   Liver Function Tests: No results found for this basename: AST, ALT, ALKPHOS, BILITOT, PROT, ALBUMIN,  in the last 168 hours No results found for this basename: LIPASE, AMYLASE,  in the last 168 hours No results found for this basename: AMMONIA,  in the last 168 hours CBC:  Recent Labs Lab 03/10/13 1325  WBC 11.6*  HGB 17.3*  HCT 50.5  MCV 90.0  PLT 205   Cardiac Enzymes:  Recent Labs Lab 03/10/13 1325  TROPONINI <0.30    BNP (last 3 results)  Recent Labs  10/21/12 0944 03/10/13 1325  PROBNP 1420.0* 2108.0*   CBG: No results found for this basename: GLUCAP,  in the last 168 hours  Radiological Exams on Admission: Dg Chest 2 View  03/10/2013   CLINICAL DATA:  Shortness of breath, weakness, hypertension, prior coronary bypass and mitral valve replacement  EXAM: CHEST  2 VIEW  COMPARISON:  10/21/2012  FINDINGS: Prior coronary bypass and mitral valve replacement noted. Heart is enlarged. Diffuse asymmetric patchy airspace process with small effusions. Pattern is nonspecific the can be seen with asymmetric edema related CHF versus pneumonia. No pneumothorax. Trachea is midline. Atherosclerosis of the aorta.  IMPRESSION: Bilateral asymmetric airspace process versus edema with small effusions.   Electronically Signed   By: Ruel Favors M.D.   On: 03/10/2013 12:24    EKG: Independently reviewed. There were variable a lot, nonspecific tear changes normal axis.  Assessment/Plan Acute bronchitis/Acute respiratory failure/Leukocytosis: - Continue Rocephin and azithromycin. Chest x-ray showed asymmetric infiltrate. - Start IV steroids as the patient is wheezing. - Continue albuterol Spiriva. - Sputum  cultures. - Low molecular heart failure exacerbation, he has no JVD is able to lay flat no lower extremity edema. In the elevation of the BNP is probably acting as an acute phase reactant. - check influenza PCR start Tamiflu.  Essential hypertension - Continue current home medication check a basic metabolic panel in the morning.  Chronic diastolic heart failure, NYHA class 2 - Compensated continue home meds.  Atrial flutter: - Currently rate controlled viral block. This is new for him. Question if this is due to to the primary pulmonary problem. Admitted to telemetry and watch him overnight if no improvement will need further evaluation.    Code Status: full Family Communication: Wife Disposition Plan: inpatient  Time spent: 80 minutes  Marinda Elk [facility] Pager [phone]

## 2013-03-11 DIAGNOSIS — I10 Essential (primary) hypertension: Secondary | ICD-10-CM

## 2013-03-11 LAB — COMPREHENSIVE METABOLIC PANEL
ALT: 6 U/L (ref 0–53)
AST: 13 U/L (ref 0–37)
Albumin: 3.3 g/dL — ABNORMAL LOW (ref 3.5–5.2)
Alkaline Phosphatase: 58 U/L (ref 39–117)
CO2: 25 mEq/L (ref 19–32)
Calcium: 8.8 mg/dL (ref 8.4–10.5)
Creatinine, Ser: 1.96 mg/dL — ABNORMAL HIGH (ref 0.50–1.35)
GFR calc Af Amer: 34 mL/min — ABNORMAL LOW (ref >90)
Glucose, Bld: 155 mg/dL — ABNORMAL HIGH (ref 70–99)
Potassium: 4.4 mEq/L (ref 3.5–5.1)
Sodium: 138 mEq/L (ref 135–145)
Total Protein: 6.9 g/dL (ref 6.0–8.3)

## 2013-03-11 LAB — CBC
HCT: 47.3 % (ref 39.0–52.0)
Hemoglobin: 15.6 g/dL (ref 13.0–17.0)
MCH: 29.5 pg (ref 26.0–34.0)
MCHC: 33 g/dL (ref 30.0–36.0)
RBC: 5.28 MIL/uL (ref 4.22–5.81)

## 2013-03-11 LAB — INFLUENZA PANEL BY PCR (TYPE A & B): Influenza B By PCR: NEGATIVE

## 2013-03-11 MED ORDER — FUROSEMIDE 20 MG PO TABS: 20.0000 mg | ORAL_TABLET | Freq: Every day | ORAL | Status: DC

## 2013-03-11 MED ORDER — ALBUTEROL SULFATE (5 MG/ML) 0.5% IN NEBU
2.5000 mg | INHALATION_SOLUTION | Freq: Three times a day (TID) | RESPIRATORY_TRACT | Status: DC
  Administered 2013-03-11 – 2013-03-13 (×7): 2.5 mg via RESPIRATORY_TRACT
  Filled 2013-03-11 (×8): qty 0.5

## 2013-03-11 NOTE — Progress Notes (Signed)
[facility] PROGRESS NOTE Interim History: 79 y.o. male  Past medical history of coronary artery disease status post CABG in 2005, with stenting in 2009, mitral annuloplasty, hypertension pulmonary fibrosis and diastolic heart failure who comes to the [facility] emergency department for shortness of breath that started 3 weeks prior to admission. He relates that at baseline he doesn't use oxygen he could walk about 100 feet without getting short of breath. But now he can he would walk to the bathroom without being short of breath. He relates a new productive cough that is change in color and is thicker.    Assessment/Plan: Acute bronchitis/ Acute respiratory failure - Continue Rocephin and azithromycin. Chest x-ray showed asymmetric infiltrate.  - Start IV steroids as the patient is wheezing.  - Continue albuterol Spiriva.  - Sputum cultures pending. - influenza PCR negative, d/c tamiflu.  Chronic diastolic heart failure, NYHA class 2 - mild increase in Cr. Hold lasix.   Leukocytosis - resolved.   Atrial flutter: - now in SR with bigeminy. - Rate controlled. - Repeat EKG. If no improvement in 24 hrs might need further evaluation.   Essential hypertension - controlled.    Code Status: full Family Communication: none  Disposition Plan: inpatient   Consultants:  none  Procedures:  none  Antibiotics:  Rocephin and Azithro 12.26.2014  HPI/Subjective: Feel better no complains.  Objective: Filed Vitals:   03/10/13 2148 03/10/13 2240 03/11/13 0616 03/11/13 0843  BP:  109/64 103/48   Pulse:  78 56   Temp:  97.6 F (36.4 C) 97.1 F (36.2 C)   TempSrc:  Oral Oral   Resp:  22 22   Height:      Weight:   90.629 kg (199 lb 12.8 oz)   SpO2: 98% 93% 94% 96%    Intake/Output Summary (Last 24 hours) at 03/11/13 1101 Last data filed at 03/11/13 4540  Gross per 24 hour  Intake   1330 ml  Output      0 ml  Net   1330 ml   Filed Weights   03/10/13 1906  03/11/13 0616  Weight: 90.9 kg (200 lb 6.4 oz) 90.629 kg (199 lb 12.8 oz)    Exam:  General: Alert, awake, oriented x3, in no acute distress.  HEENT: No bruits, no goiter.  Heart: Regular rate and rhythm, without murmurs, rubs, gallops.  Lungs: Good air movement, wheezing B/L Abdomen: Soft, nontender, nondistended, positive bowel sounds.    Data Reviewed: Basic Metabolic Panel:  Recent Labs Lab 03/10/13 1325 03/11/13 0423  NA 138 138  K 4.7 4.4  CL 100 102  CO2 27 25  GLUCOSE 103* 155*  BUN 29* 32*  CREATININE 1.77* 1.96*  CALCIUM 9.3 8.8   Liver Function Tests:  Recent Labs Lab 03/11/13 0423  AST 13  ALT 6  ALKPHOS 58  BILITOT 0.4  PROT 6.9  ALBUMIN 3.3*   No results found for this basename: LIPASE, AMYLASE,  in the last 168 hours No results found for this basename: AMMONIA,  in the last 168 hours CBC:  Recent Labs Lab 03/10/13 1325 03/11/13 0423  WBC 11.6* 7.8  HGB 17.3* 15.6  HCT 50.5 47.3  MCV 90.0 89.6  PLT 205 191   Cardiac Enzymes:  Recent Labs Lab 03/10/13 1325  TROPONINI <0.30   BNP (last 3 results)  Recent Labs  10/21/12 0944 03/10/13 1325  PROBNP 1420.0* 2108.0*   CBG: No results found for this basename: GLUCAP,  in the last  168 hours  No results found for this or any previous visit (from the past 240 hour(s)).   Studies: Dg Chest 2 View  03/10/2013   CLINICAL DATA:  Shortness of breath, weakness, hypertension, prior coronary bypass and mitral valve replacement  EXAM: CHEST  2 VIEW  COMPARISON:  10/21/2012  FINDINGS: Prior coronary bypass and mitral valve replacement noted. Heart is enlarged. Diffuse asymmetric patchy airspace process with small effusions. Pattern is nonspecific the can be seen with asymmetric edema related CHF versus pneumonia. No pneumothorax. Trachea is midline. Atherosclerosis of the aorta.  IMPRESSION: Bilateral asymmetric airspace process versus edema with small effusions.   Electronically Signed   By:  Ruel Favors M.D.   On: 03/10/2013 12:24    Scheduled Meds: . albuterol  2.5 mg Nebulization TID  . amLODipine  10 mg Oral Daily  . aspirin  81 mg Oral Daily  . atorvastatin  20 mg Oral q1800  . azithromycin  500 mg Intravenous Q24H  . carvedilol  12.5 mg Oral Daily  . cefTRIAXone (ROCEPHIN)  IV  1 g Intravenous Q24H  . furosemide  20 mg Oral Daily  . guaiFENesin  600 mg Oral BID  . heparin  5,000 Units Subcutaneous Q8H  . methylPREDNISolone (SOLU-MEDROL) injection  80 mg Intravenous Q12H  . oseltamivir  75 mg Oral BID  . pantoprazole  40 mg Oral Daily  . sodium chloride  3 mL Intravenous Q12H   Continuous Infusions:    Marinda Elk  [facility] Pager [phone]. If 8PM-8AM, please contact night-coverage at [email or web address], password [email or web address] 03/11/2013, 11:01 AM  LOS: 1 day

## 2013-03-11 NOTE — ED Provider Notes (Signed)
Medical screening examination/treatment/procedure(s) were conducted as a shared visit with non-physician practitioner(s) and myself.  I personally evaluated the patient during the encounter.  EKG Interpretation    Date/Time:  Friday March 10 2013 15:36:56 EST Ventricular Rate:  65 PR Interval:    QRS Duration: 136 QT Interval:  461 QTC Calculation: 479 R Axis:   73 Text Interpretation:  Age not entered, assumed to be  79 years old for purpose of ECG interpretation Atrial flutter with varied AV block, Right bundle branch block Baseline wander in lead(s) II III aVF Confirmed by Zayneb Baucum  MD, Jehiel Koepp ([id number]) on 03/10/2013 3:46:43 PM            Patient with PNA vs CHF. He feels it is more related to PNA, he has had multiple times. Is stable at this time but has new O2 requirement. Will admit to hospitalist.  Audree Camel, MD 03/11/13 [phone]

## 2013-03-12 DIAGNOSIS — I5033 Acute on chronic diastolic (congestive) heart failure: Secondary | ICD-10-CM

## 2013-03-12 DIAGNOSIS — I2510 Atherosclerotic heart disease of native coronary artery without angina pectoris: Secondary | ICD-10-CM

## 2013-03-12 DIAGNOSIS — I509 Heart failure, unspecified: Secondary | ICD-10-CM

## 2013-03-12 DIAGNOSIS — D72829 Elevated white blood cell count, unspecified: Secondary | ICD-10-CM

## 2013-03-12 DIAGNOSIS — E785 Hyperlipidemia, unspecified: Secondary | ICD-10-CM

## 2013-03-12 DIAGNOSIS — R0602 Shortness of breath: Secondary | ICD-10-CM

## 2013-03-12 LAB — BASIC METABOLIC PANEL
BUN: 53 mg/dL — ABNORMAL HIGH (ref 6–23)
Chloride: 97 mEq/L (ref 96–112)
GFR calc Af Amer: 27 mL/min — ABNORMAL LOW (ref >90)
Glucose, Bld: 141 mg/dL — ABNORMAL HIGH (ref 70–99)
Potassium: 4.4 mEq/L (ref 3.5–5.1)

## 2013-03-12 LAB — MAGNESIUM: Magnesium: 2.1 mg/dL (ref 1.5–2.5)

## 2013-03-12 LAB — TSH: TSH: 0.778 u[IU]/mL (ref 0.350–4.500)

## 2013-03-12 MED ORDER — AZITHROMYCIN 500 MG PO TABS
500.0000 mg | ORAL_TABLET | ORAL | Status: DC
  Administered 2013-03-12 – 2013-03-13 (×2): 500 mg via ORAL
  Filled 2013-03-12 (×3): qty 1

## 2013-03-12 NOTE — Progress Notes (Signed)
[facility]                                                                                Patient Demographics  Cody [name], is a 79 y.o. male, DOB - 1927-02-05, ZOX:[id number]  Admit date - 03/10/2013   Admitting Physician Cody Elk, MD  Outpatient Primary MD for the patient is Cody Hakim, MD  LOS - 2   Chief Complaint  Patient presents with  . Shortness of Breath  . Weakness        Assessment & Plan    Principal Problem:   Acute bronchitis Active Problems:   Essential hypertension   Chronic diastolic heart failure, NYHA class 2   Acute respiratory failure   Leukocytosis   Atrial flutter  Acute bronchitis/ Acute respiratory failure  -Continue Rocephin and azithromycin. Chest x-ray showed asymmetric infiltrate.  -Start IV steroids as the patient is wheezing.  -Continue albuterol Spiriva.  -Sputum cultures pending.  -influenza PCR negative, d/c tamiflu.   Chronic diastolic heart failure, NYHA class 2  -mild increase in Cr -will restart Lasix 20mg  PO today  Leukocytosis  -resolved.   Atrial flutter:  -Currently rate controlled, continue coreg -May consider Cardio consult -Will obtain TSH, Mg, Phos levels  Essential hypertension  -controlled, continue amlodipine, coreg -Will restart PO lasix   CKD, stage 3 -Stable, Cr 1.96, pending labs today  Code Status: Full  Family Communication: None  Disposition Plan: Admitted.  Will discharge once stable.   Procedures none  Consults  none  DVT Prophylaxis Heparin   Lab Results  Component Value Date   PLT 191 03/11/2013    Medications  Scheduled Meds: . albuterol  2.5 mg Nebulization TID  . amLODipine  10 mg Oral Daily  . aspirin  81 mg Oral Daily  . atorvastatin  20 mg Oral q1800  . azithromycin  500 mg Intravenous Q24H  . carvedilol  12.5 mg Oral Daily  . cefTRIAXone (ROCEPHIN)  IV  1 g Intravenous Q24H  . [START ON 03/13/2013] furosemide  20 mg Oral Daily  .  guaiFENesin  600 mg Oral BID  . heparin  5,000 Units Subcutaneous Q8H  . methylPREDNISolone (SOLU-MEDROL) injection  80 mg Intravenous Q12H  . pantoprazole  40 mg Oral Daily  . sodium chloride  3 mL Intravenous Q12H   Continuous Infusions:  PRN Meds:.sodium chloride, acetaminophen, acetaminophen, albuterol, menthol-cetylpyridinium, ondansetron (ZOFRAN) IV, ondansetron, polyethylene glycol, sodium chloride  Antibiotics    Anti-infectives   Start     Dose/Rate Route Frequency Ordered Stop   03/10/13 2200  oseltamivir (TAMIFLU) capsule 75 mg  Status:  Discontinued     75 mg Oral 2 times daily 03/10/13 1940 03/11/13 1102   03/10/13 2100  cefTRIAXone (ROCEPHIN) 1 g in dextrose 5 % 50 mL IVPB     1 g 100 mL/hr over 30 Minutes Intravenous Every 24 hours 03/10/13 1940     03/10/13 2100  azithromycin (ZITHROMAX) 500 mg in dextrose 5 % 250 mL IVPB     500 mg 250 mL/hr over 60 Minutes Intravenous Every 24 hours 03/10/13 1940     03/10/13 1545  cefTRIAXone (ROCEPHIN) 1 g in dextrose  5 % 50 mL IVPB     1 g 100 mL/hr over 30 Minutes Intravenous  Once 03/10/13 1536 03/10/13 1842   03/10/13 1545  azithromycin (ZITHROMAX) 500 mg in dextrose 5 % 250 mL IVPB  Status:  Discontinued     500 mg 250 mL/hr over 60 Minutes Intravenous Every 24 hours 03/10/13 1536 03/10/13 1940       Time Spent in minutes   30 minutes   Cody [name] D.O. on 03/12/2013 at 9:56 AM  Between 7am to 7pm - Pager - [phone]  After 7pm go to [email or web address] - password [email or web address]  And look for the night coverage person covering for me after hours  [facility] Office  [phone]    Subjective:   Cody [name] seen and examined today.   Patient has no complaints at this time.  States his weight is up, however, he has not eaten in days.  He states he is not SOB and denies chest pain.  Objective:   Filed Vitals:   03/11/13 1956 03/11/13 2012 03/12/13 0438 03/12/13 0859  BP:  94/54 105/46   Pulse:  75  87   Temp:  97.6 F (36.4 C) 97.6 F (36.4 C)   TempSrc:  Oral Oral   Resp:  18 18   Height:      Weight:   93.123 kg (205 lb 4.8 oz)   SpO2: 100% 100% 100% 96%    Wt Readings from Last 3 Encounters:  03/12/13 93.123 kg (205 lb 4.8 oz)  12/05/12 94.121 kg (207 lb 8 oz)  10/21/12 92.534 kg (204 lb)     Intake/Output Summary (Last 24 hours) at 03/12/13 0956 Last data filed at 03/12/13 2956  Gross per 24 hour  Intake    720 ml  Output    450 ml  Net    270 ml    Exam  General: Well developed, well nourished, NAD, appears stated age  HEENT: NCAT, PERRLA, EOMI, Anicteic Sclera, mucous membranes moist. No pharyngeal erythema or exudates  Neck: Supple, no JVD, no masses  Cardiovascular: S1 S2 auscultated, irregularly irregular  Respiratory: Clear but diminished breath sounds   Abdomen: Soft, nontender, nondistended, + bowel sounds  Extremities: warm dry without cyanosis clubbing or edema  Neuro: AAOx3, cranial nerves grossly intact. Strength 5/5 in patient's upper and lower extremities bilaterally  Skin: Without rashes exudates or nodules  Psych: Normal affect and demeanor with intact judgement and insight  Data Review   Micro Results Recent Results (from the past 240 hour(s))  CULTURE, BLOOD (ROUTINE X 2)     Status: None   Collection Time    03/10/13  5:05 PM      Result Value Range Status   Specimen Description BLOOD ARM RIGHT   Final   Special Requests BOTTLES DRAWN AEROBIC ONLY 10CC   Final   Culture  Setup Time     Final   Value: 03/10/2013 20:43     Performed at [facility]   Culture     Final   Value:        BLOOD CULTURE RECEIVED NO GROWTH TO DATE CULTURE WILL BE HELD FOR 5 DAYS BEFORE ISSUING A FINAL NEGATIVE REPORT     Performed at [facility]   Report Status PENDING   Incomplete  CULTURE, BLOOD (ROUTINE X 2)     Status: None   Collection Time    03/10/13  5:10 PM      Result Value  Range Status   Specimen Description BLOOD  HAND RIGHT   Final   Special Requests BOTTLES DRAWN AEROBIC AND ANAEROBIC 10CC   Final   Culture  Setup Time     Final   Value: 03/10/2013 20:44     Performed at [facility]   Culture     Final   Value:        BLOOD CULTURE RECEIVED NO GROWTH TO DATE CULTURE WILL BE HELD FOR 5 DAYS BEFORE ISSUING A FINAL NEGATIVE REPORT     Performed at [facility]   Report Status PENDING   Incomplete    Radiology Reports Dg Chest 2 View  03/10/2013   CLINICAL DATA:  Shortness of breath, weakness, hypertension, prior coronary bypass and mitral valve replacement  EXAM: CHEST  2 VIEW  COMPARISON:  10/21/2012  FINDINGS: Prior coronary bypass and mitral valve replacement noted. Heart is enlarged. Diffuse asymmetric patchy airspace process with small effusions. Pattern is nonspecific the can be seen with asymmetric edema related CHF versus pneumonia. No pneumothorax. Trachea is midline. Atherosclerosis of the aorta.  IMPRESSION: Bilateral asymmetric airspace process versus edema with small effusions.   Electronically Signed   By: Ruel Favors M.D.   On: 03/10/2013 12:24    CBC  Recent Labs Lab 03/10/13 1325 03/11/13 0423  WBC 11.6* 7.8  HGB 17.3* 15.6  HCT 50.5 47.3  PLT 205 191  MCV 90.0 89.6  MCH 30.8 29.5  MCHC 34.3 33.0  RDW 15.0 14.9    Chemistries   Recent Labs Lab 03/10/13 1325 03/11/13 0423  NA 138 138  K 4.7 4.4  CL 100 102  CO2 27 25  GLUCOSE 103* 155*  BUN 29* 32*  CREATININE 1.77* 1.96*  CALCIUM 9.3 8.8  AST  --  13  ALT  --  6  ALKPHOS  --  58  BILITOT  --  0.4   ------------------------------------------------------------------------------------------------------------------ estimated creatinine clearance is 32.6 ml/min (by C-G formula based on Cr of 1.96). ------------------------------------------------------------------------------------------------------------------ No results found for this basename: HGBA1C,  in the last 72  hours ------------------------------------------------------------------------------------------------------------------ No results found for this basename: CHOL, HDL, LDLCALC, TRIG, CHOLHDL, LDLDIRECT,  in the last 72 hours ------------------------------------------------------------------------------------------------------------------ No results found for this basename: TSH, T4TOTAL, FREET3, T3FREE, THYROIDAB,  in the last 72 hours ------------------------------------------------------------------------------------------------------------------ No results found for this basename: VITAMINB12, FOLATE, FERRITIN, TIBC, IRON, RETICCTPCT,  in the last 72 hours  Coagulation profile No results found for this basename: INR, PROTIME,  in the last 168 hours  No results found for this basename: DDIMER,  in the last 72 hours  Cardiac Enzymes  Recent Labs Lab 03/10/13 1325  TROPONINI <0.30   ------------------------------------------------------------------------------------------------------------------ No components found with this basename: POCBNP,

## 2013-03-13 ENCOUNTER — Ambulatory Visit: Payer: Medicare Other | Admitting: Cardiovascular Disease

## 2013-03-13 ENCOUNTER — Inpatient Hospital Stay (HOSPITAL_COMMUNITY): Payer: Medicare Other

## 2013-03-13 DIAGNOSIS — N179 Acute kidney failure, unspecified: Secondary | ICD-10-CM

## 2013-03-13 DIAGNOSIS — R609 Edema, unspecified: Secondary | ICD-10-CM

## 2013-03-13 LAB — CBC
HCT: 43.4 % (ref 39.0–52.0)
Hemoglobin: 14.5 g/dL (ref 13.0–17.0)
RDW: 14.8 % (ref 11.5–15.5)
WBC: 16.7 10*3/uL — ABNORMAL HIGH (ref 4.0–10.5)

## 2013-03-13 LAB — URINALYSIS, ROUTINE W REFLEX MICROSCOPIC
Bilirubin Urine: NEGATIVE
Glucose, UA: NEGATIVE mg/dL
Hgb urine dipstick: NEGATIVE
Ketones, ur: NEGATIVE mg/dL
Leukocytes, UA: NEGATIVE
Nitrite: NEGATIVE
Protein, ur: NEGATIVE mg/dL
pH: 5 (ref 5.0–8.0)

## 2013-03-13 LAB — BASIC METABOLIC PANEL
BUN: 67 mg/dL — ABNORMAL HIGH (ref 6–23)
Chloride: 101 mEq/L (ref 96–112)
GFR calc Af Amer: 24 mL/min — ABNORMAL LOW (ref >90)
Glucose, Bld: 149 mg/dL — ABNORMAL HIGH (ref 70–99)
Potassium: 4.4 mEq/L (ref 3.5–5.1)
Sodium: 137 mEq/L (ref 135–145)

## 2013-03-13 MED ORDER — ALBUTEROL SULFATE (2.5 MG/3ML) 0.083% IN NEBU: 2.5000 mg | INHALATION_SOLUTION | RESPIRATORY_TRACT | Status: DC | PRN

## 2013-03-13 MED ORDER — ALBUTEROL SULFATE (5 MG/ML) 0.5% IN NEBU: 2.5000 mg | INHALATION_SOLUTION | Freq: Two times a day (BID) | RESPIRATORY_TRACT | Status: DC

## 2013-03-13 MED ORDER — ALBUTEROL SULFATE (2.5 MG/3ML) 0.083% IN NEBU: 2.5000 mg | INHALATION_SOLUTION | Freq: Two times a day (BID) | RESPIRATORY_TRACT | Status: DC

## 2013-03-13 NOTE — Consult Note (Signed)
Interim History:  79 y.o. male with a past medical history of coronary artery disease status post CABG in 2005, with stenting in 2009, mitral annuloplasty, hypertension pulmonary fibrosis and diastolic heart failure who comes to the [facility] emergency department for shortness of breath that started 3 weeks prior to admission. He takes furosemide at home daily with occasional adjustments based upon his weight status per his cardiologist. He was found to have CXR with assymetric edema  V airspace process abnd sm effusions.  He was diuresed on low dose furosemide.  He denies NSAIDS or ACE-I rx; although Naprosyn listed on pre-hospital meds.Marland Kitchen"I don't have arthritis".  During course of hospitalization creatinine has risen from baseline CKD and Renal is asked to assist with management.   Results for Cody, [name] (MRN [id number]) as of 03/13/2013 18:48  Ref. Range 05/26/2012 10:11 10/21/2012 09:44 03/10/2013 13:25 03/11/2013 04:23 03/12/2013 11:10 03/13/2013 05:30  Creatinine Latest Range: 0.50-1.35 mg/dL 4.09 (H) 8.11 (H) 9.14 (H) 1.96 (H) 2.40 (H) 2.65 (H)     Past Medical History  Diagnosis Date  . Hyperlipidemia   . Hypertension   . Coronary artery disease     a. CABG 2005. b. 2009: PTCA/atherotomy->LPDA, stent to prox-mid LCx, & PTCA of stent jailed OM1.  . Carotid artery disease     a. Prior R CEA. b. Parent RICA s/p CEA with patch angioplasty, 40-59% LICA - f/u recommended 01/2013  . Congestive heart failure     a. EF 40% prior to CABG. b. EF 50-55% in 2012.  . Mitral regurgitation     a. Ischemic MR s/p mitral ring annuloplasty 2005 at time of CABG.  . Pulmonary fibrosis     Previous tobacco history & asphalt exposure, was told he had severe "black lungs" by cardiothoracic surgeon at time of CABG. Was supposed to have PFTs 05/2012 by pulm but missed due to bad weather.  Marland Kitchen RBBB    Past Surgical History  Procedure Laterality Date  . Coronary artery bypass graft  06/2003    x 4, valve  repair  . Cardiac catheterization  2009    PCI of left circumflex/ PTCA of an OMI  . Back surgery  1993  . Mitral annuoplasty     Social History:  reports that he quit smoking about 47 years ago. His smoking use included Cigarettes. He has a 20 pack-year smoking history. He has never used smokeless tobacco. He reports that he does not drink alcohol or use illicit drugs. Allergies:  Allergies  Allergen Reactions  . Codeine Anxiety   Family History  Problem Relation Age of Onset  . Heart attack Father   . Thyroid disease Other   . Hyperlipidemia Other   . Bone cancer Mother     Medications:  Prior to Admission:  Prescriptions prior to admission  Medication Sig Dispense Refill  . albuterol (PROVENTIL HFA;VENTOLIN HFA) 108 (90 BASE) MCG/ACT inhaler Inhale 2 puffs into the lungs every 6 (six) hours as needed for wheezing.  1 Inhaler  2  . amLODipine (NORVASC) 10 MG tablet Take 1 tablet (10 mg total) by mouth daily.  90 tablet  3  . aspirin 81 MG EC tablet Take 81 mg by mouth daily.        . carvedilol (COREG) 25 MG tablet Take 12.5 mg by mouth daily.      . furosemide (LASIX) 20 MG tablet Take 1 tablet (20 mg total) by mouth 2 (two) times daily as needed.  60 tablet  6  . guaiFENesin (MUCINEX) 600 MG 12 hr tablet Take 600 mg by mouth 2 (two) times daily.      Marland Kitchen menthol-cetylpyridinium (CEPACOL) 3 MG lozenge Take 1 lozenge by mouth daily as needed for sore throat.      . naproxen sodium (ANAPROX) 220 MG tablet Take 440 mg by mouth 2 (two) times daily with a meal.      . nitroGLYCERIN (NITROSTAT) 0.4 MG SL tablet Place 1 tablet (0.4 mg total) under the tongue every 5 (five) minutes as needed for chest pain.  25 tablet  3  . Omeprazole 20 MG TBEC Take 20 mg by mouth daily. Take 30-60 min before first meal of the day      . simvastatin (ZOCOR) 40 MG tablet Take 40 mg by mouth every evening.       Scheduled: . albuterol  2.5 mg Nebulization BID  . amLODipine  10 mg Oral Daily  . aspirin   81 mg Oral Daily  . atorvastatin  20 mg Oral q1800  . azithromycin  500 mg Oral Q24H  . carvedilol  12.5 mg Oral Daily  . cefTRIAXone (ROCEPHIN)  IV  1 g Intravenous Q24H  . guaiFENesin  600 mg Oral BID  . heparin  5,000 Units Subcutaneous Q8H  . methylPREDNISolone (SOLU-MEDROL) injection  80 mg Intravenous Q12H  . pantoprazole  40 mg Oral Daily  . sodium chloride  3 mL Intravenous Q12H   ROS: denises significant orthopnea or PND or GU flow issues. No arthritis or melena   Blood pressure 120/59, pulse 64, temperature 97.1 F (36.2 C), temperature source Oral, resp. rate 20, height 6\' 2"  (1.88 m), weight 93.577 kg (206 lb 4.8 oz), SpO2 97.00%.  General appearance: alert and cooperative Head: Normocephalic, without obvious abnormality, atraumatic Eyes: negative Nose: Nares normal. Septum midline. Mucosa normal. No drainage or sinus tenderness. Throat: lips, mucosa, and tongue normal; teeth and gums normal Resp: wheezes bilaterally Chest wall: no tenderness Cardio: regular rate and rhythm, S1, S2 normal, no murmur, click, rub or gallop GI: soft, non-tender; bowel sounds normal; no masses,  no organomegaly Extremities: edema tr to 1+ Skin: Skin color, texture, turgor normal. No rashes or lesions Neurologic: Grossly normal Results for orders placed during the hospital encounter of 03/10/13 (from the past 48 hour(s))  BASIC METABOLIC PANEL     Status: Abnormal   Collection Time    03/12/13 11:10 AM      Result Value Range   Sodium 133 (*) 135 - 145 mEq/L   Potassium 4.4  3.5 - 5.1 mEq/L   Chloride 97  96 - 112 mEq/L   CO2 20  19 - 32 mEq/L   Glucose, Bld 141 (*) 70 - 99 mg/dL   BUN 53 (*) 6 - 23 mg/dL   Creatinine, Ser 4.09 (*) 0.50 - 1.35 mg/dL   Calcium 9.0  8.4 - 81.1 mg/dL   GFR calc non Af Amer 23 (*) >90 mL/min   GFR calc Af Amer 27 (*) >90 mL/min   Comment: (NOTE)     The eGFR has been calculated using the CKD EPI equation.     This calculation has not been validated in  all clinical situations.     eGFR's persistently <90 mL/min signify possible Chronic Kidney     Disease.  MAGNESIUM     Status: None   Collection Time    03/12/13 12:45 PM      Result Value Range   Magnesium 2.1  1.5 - 2.5 mg/dL  PHOSPHORUS     Status: None   Collection Time    03/12/13 12:45 PM      Result Value Range   Phosphorus 4.0  2.3 - 4.6 mg/dL  TSH     Status: None   Collection Time    03/12/13 12:45 PM      Result Value Range   TSH 0.778  0.350 - 4.500 uIU/mL   Comment: Performed at [facility]  CBC     Status: Abnormal   Collection Time    03/13/13  5:30 AM      Result Value Range   WBC 16.7 (*) 4.0 - 10.5 K/uL   RBC 4.85  4.22 - 5.81 MIL/uL   Hemoglobin 14.5  13.0 - 17.0 g/dL   HCT 86.5  78.4 - 69.6 %   MCV 89.5  78.0 - 100.0 fL   MCH 29.9  26.0 - 34.0 pg   MCHC 33.4  30.0 - 36.0 g/dL   RDW 29.5  28.4 - 13.2 %   Platelets 188  150 - 400 K/uL  BASIC METABOLIC PANEL     Status: Abnormal   Collection Time    03/13/13  5:30 AM      Result Value Range   Sodium 137  135 - 145 mEq/L   Potassium 4.4  3.5 - 5.1 mEq/L   Chloride 101  96 - 112 mEq/L   CO2 22  19 - 32 mEq/L   Glucose, Bld 149 (*) 70 - 99 mg/dL   BUN 67 (*) 6 - 23 mg/dL   Creatinine, Ser 4.40 (*) 0.50 - 1.35 mg/dL   Calcium 8.7  8.4 - 10.2 mg/dL   GFR calc non Af Amer 21 (*) >90 mL/min   GFR calc Af Amer 24 (*) >90 mL/min   Comment: (NOTE)     The eGFR has been calculated using the CKD EPI equation.     This calculation has not been validated in all clinical situations.     eGFR's persistently <90 mL/min signify possible Chronic Kidney     Disease.   No results found.  Assessment:  1 AKI probably hemodynamically medicated 2 Stage 3 CKD  Plan: 1 Will review renal ultrasound just done 2 Obtain UA 3 Agree withholding diuretics.  Wil check CXR as suspect major fluid component should have improved although weights don't reflect weight loss nor do Is And Os. In addition steroids  contributing to azotemia 4 Will follow.  Nadim Malia C 03/13/2013, 6:46 PM

## 2013-03-13 NOTE — Care Management Note (Addendum)
  Page 1 of 1   03/13/2013     2:50:18 PM   CARE MANAGEMENT NOTE 03/13/2013  Patient:  LUCIEN, BUDNEY   Account Number:  [id number]  Date Initiated:  03/13/2013  Documentation initiated by:  [facility]  Subjective/Objective Assessment:   Chief Complaint  Patient presents with  .  Shortness of Breath  .  Weakness  //Home with spouse     Action/Plan:   started on Rocephin and Cipro in the emergency room.  //Home with self care   Anticipated DC Date:  03/16/2013   Anticipated DC Plan:  HOME/SELF CARE      DC Planning Services  CM consult      Choice offered to / List presented to:             Status of service:   Medicare Important Message given?   (If response is "NO", the following Medicare IM given date fields will be blank) Date Medicare IM given:   Date Additional Medicare IM given:    Discharge Disposition:    Per UR Regulation:    If discussed at Long Length of Stay Meetings, dates discussed:    Comments:  03/13/13 Oletta Cohn, RN, BSN, NCM [phone]     Contact Info:    Name                                 Relation                  Home                   Work                     Mobile   [place]               Spouse                [phone]      [phone]   [place], L[place]ler                                                                          [phone]

## 2013-03-13 NOTE — Progress Notes (Signed)
[facility]                                                                                Patient Demographics  Cody [name], is a 79 y.o. male, DOB - [birth year], ZOX:[id number]  Admit date - 03/10/2013   Admitting Physician Marinda Elk, MD  Outpatient Primary MD for the patient is Clydell Hakim, MD  LOS - 3   Chief Complaint  Patient presents with  . Shortness of Breath  . Weakness        Assessment & Plan    Principal Problem:   Acute bronchitis Active Problems:   Essential hypertension   Chronic diastolic heart failure, NYHA class 2   Acute respiratory failure   Leukocytosis   Atrial flutter  Acute bronchitis/ Acute respiratory failure  -Improving, Continue Rocephin and azithromycin. Chest x-ray showed asymmetric infiltrate.  -Will begin to wean IV steroids -Continue albuterol Spiriva.  -Sputum cultures pending.  -influenza PCR negative, d/c tamiflu.   AKI on CKD, stage 3 -Cr 2.65  -Will order renal ultrasound -Will not order urine electrolytes as patient has been on lasix -Will consult nephrology  Chronic diastolic heart failure, NYHA class 2  -mild increase in Cr -lasix discontinued -Placed patient on strict I's and O's, daily weights, and fluid restriction  Leukocytosis  -WBC 16.7, likely acute phase reactant to steroids  Atrial flutter:  -Currently rate controlled, continue coreg  Essential hypertension  -controlled, continue amlodipine, coreg -Will restart PO lasix   Code Status: Full  Family Communication: None  Disposition Plan: Admitted.  Will discharge once stable.   Procedures none  Consults  none  DVT Prophylaxis Heparin   Lab Results  Component Value Date   PLT 188 03/13/2013    Medications  Scheduled Meds: . albuterol  2.5 mg Nebulization BID  . amLODipine  10 mg Oral Daily  . aspirin  81 mg Oral Daily  . atorvastatin  20 mg Oral q1800  . azithromycin  500 mg Oral Q24H  . carvedilol  12.5 mg  Oral Daily  . cefTRIAXone (ROCEPHIN)  IV  1 g Intravenous Q24H  . guaiFENesin  600 mg Oral BID  . heparin  5,000 Units Subcutaneous Q8H  . methylPREDNISolone (SOLU-MEDROL) injection  80 mg Intravenous Q12H  . pantoprazole  40 mg Oral Daily  . sodium chloride  3 mL Intravenous Q12H   Continuous Infusions:  PRN Meds:.sodium chloride, acetaminophen, acetaminophen, albuterol, menthol-cetylpyridinium, ondansetron (ZOFRAN) IV, ondansetron, polyethylene glycol, sodium chloride  Antibiotics    Anti-infectives   Start     Dose/Rate Route Frequency Ordered Stop   03/12/13 2100  azithromycin (ZITHROMAX) tablet 500 mg     500 mg Oral Every 24 hours 03/12/13 1249     03/10/13 2200  oseltamivir (TAMIFLU) capsule 75 mg  Status:  Discontinued     75 mg Oral 2 times daily 03/10/13 1940 03/11/13 1102   03/10/13 2100  cefTRIAXone (ROCEPHIN) 1 g in dextrose 5 % 50 mL IVPB     1 g 100 mL/hr over 30 Minutes Intravenous Every 24 hours 03/10/13 1940     03/10/13 2100  azithromycin (ZITHROMAX) 500 mg in dextrose 5 % 250  mL IVPB  Status:  Discontinued     500 mg 250 mL/hr over 60 Minutes Intravenous Every 24 hours 03/10/13 1940 03/12/13 1248   03/10/13 1545  cefTRIAXone (ROCEPHIN) 1 g in dextrose 5 % 50 mL IVPB     1 g 100 mL/hr over 30 Minutes Intravenous  Once 03/10/13 1536 03/10/13 1842   03/10/13 1545  azithromycin (ZITHROMAX) 500 mg in dextrose 5 % 250 mL IVPB  Status:  Discontinued     500 mg 250 mL/hr over 60 Minutes Intravenous Every 24 hours 03/10/13 1536 03/10/13 1940       Time Spent in minutes   30 minutes   [place] Luppino D.O. on 03/13/2013 at 12:05 PM  Between 7am to 7pm - Pager - [phone]  After 7pm go to [email or web address] - password [email or web address]  And look for the night coverage person covering for me after hours  [facility] Office  [phone]    Subjective:   Cody [name] seen and examined today.   Patient has no complaints at this time.  Patient states he feels  better and has been able to walk around.  He no longer has swelling in his legs.  Patient states his appetite is better.  Objective:   Filed Vitals:   03/12/13 2141 03/13/13 0703 03/13/13 0946 03/13/13 1145  BP: 100/48 115/58  111/54  Pulse: 58 81  69  Temp: 97.1 F (36.2 C) 97.3 F (36.3 C)    TempSrc: Oral Oral    Resp: 18 20    Height:      Weight:  93.577 kg (206 lb 4.8 oz)    SpO2: 95% 93% 97%     Wt Readings from Last 3 Encounters:  03/13/13 93.577 kg (206 lb 4.8 oz)  12/05/12 94.121 kg (207 lb 8 oz)  10/21/12 92.534 kg (204 lb)     Intake/Output Summary (Last 24 hours) at 03/13/13 1205 Last data filed at 03/13/13 1146  Gross per 24 hour  Intake    603 ml  Output    300 ml  Net    303 ml    Exam  General: Well developed, well nourished, NAD, appears stated age  HEENT: NCAT, PERRLA, EOMI, Anicteic Sclera, mucous membranes moist.   Neck: Supple, no JVD, no masses  Cardiovascular: S1 S2 auscultated, irregularly irregular  Respiratory: Clear but diminished breath sounds   Abdomen: Soft, nontender, nondistended, + bowel sounds  Extremities: warm dry without cyanosis clubbing.  Neuro: AAOx3, cranial nerves grossly intact.   Skin: Without rashes exudates or nodules  Psych: Normal affect and demeanor with intact judgement and insight  Data Review   Micro Results Recent Results (from the past 240 hour(s))  CULTURE, BLOOD (ROUTINE X 2)     Status: None   Collection Time    03/10/13  5:05 PM      Result Value Range Status   Specimen Description BLOOD ARM RIGHT   Final   Special Requests BOTTLES DRAWN AEROBIC ONLY 10CC   Final   Culture  Setup Time     Final   Value: 03/10/2013 20:43     Performed at [facility]   Culture     Final   Value:        BLOOD CULTURE RECEIVED NO GROWTH TO DATE CULTURE WILL BE HELD FOR 5 DAYS BEFORE ISSUING A FINAL NEGATIVE REPORT     Performed at [facility]   Report Status PENDING   Incomplete  CULTURE,  BLOOD (ROUTINE X 2)     Status: None   Collection Time    03/10/13  5:10 PM      Result Value Range Status   Specimen Description BLOOD HAND RIGHT   Final   Special Requests BOTTLES DRAWN AEROBIC AND ANAEROBIC 10CC   Final   Culture  Setup Time     Final   Value: 03/10/2013 20:44     Performed at [facility]   Culture     Final   Value:        BLOOD CULTURE RECEIVED NO GROWTH TO DATE CULTURE WILL BE HELD FOR 5 DAYS BEFORE ISSUING A FINAL NEGATIVE REPORT     Performed at [facility]   Report Status PENDING   Incomplete    Radiology Reports Dg Chest 2 View  03/10/2013   CLINICAL DATA:  Shortness of breath, weakness, hypertension, prior coronary bypass and mitral valve replacement  EXAM: CHEST  2 VIEW  COMPARISON:  10/21/2012  FINDINGS: Prior coronary bypass and mitral valve replacement noted. Heart is enlarged. Diffuse asymmetric patchy airspace process with small effusions. Pattern is nonspecific the can be seen with asymmetric edema related CHF versus pneumonia. No pneumothorax. Trachea is midline. Atherosclerosis of the aorta.  IMPRESSION: Bilateral asymmetric airspace process versus edema with small effusions.   Electronically Signed   By: Ruel Favors M.D.   On: 03/10/2013 12:24    CBC  Recent Labs Lab 03/10/13 1325 03/11/13 0423 03/13/13 0530  WBC 11.6* 7.8 16.7*  HGB 17.3* 15.6 14.5  HCT 50.5 47.3 43.4  PLT 205 191 188  MCV 90.0 89.6 89.5  MCH 30.8 29.5 29.9  MCHC 34.3 33.0 33.4  RDW 15.0 14.9 14.8    Chemistries   Recent Labs Lab 03/10/13 1325 03/11/13 0423 03/12/13 1110 03/12/13 1245 03/13/13 0530  NA 138 138 133*  --  137  K 4.7 4.4 4.4  --  4.4  CL 100 102 97  --  101  CO2 27 25 20   --  22  GLUCOSE 103* 155* 141*  --  149*  BUN 29* 32* 53*  --  67*  CREATININE 1.77* 1.96* 2.40*  --  2.65*  CALCIUM 9.3 8.8 9.0  --  8.7  MG  --   --   --  2.1  --   AST  --  13  --   --   --   ALT  --  6  --   --   --   ALKPHOS  --  58  --   --   --    BILITOT  --  0.4  --   --   --    ------------------------------------------------------------------------------------------------------------------ estimated creatinine clearance is 24.1 ml/min (by C-G formula based on Cr of 2.65). ------------------------------------------------------------------------------------------------------------------ No results found for this basename: HGBA1C,  in the last 72 hours ------------------------------------------------------------------------------------------------------------------ No results found for this basename: CHOL, HDL, LDLCALC, TRIG, CHOLHDL, LDLDIRECT,  in the last 72 hours ------------------------------------------------------------------------------------------------------------------  Recent Labs  03/12/13 1245  TSH 0.778   ------------------------------------------------------------------------------------------------------------------ No results found for this basename: VITAMINB12, FOLATE, FERRITIN, TIBC, IRON, RETICCTPCT,  in the last 72 hours  Coagulation profile No results found for this basename: INR, PROTIME,  in the last 168 hours  No results found for this basename: DDIMER,  in the last 72 hours  Cardiac Enzymes  Recent Labs Lab 03/10/13 1325  TROPONINI <0.30   ------------------------------------------------------------------------------------------------------------------ No components found with this basename: POCBNP,

## 2013-03-13 NOTE — Progress Notes (Signed)
Report given to receiving RN. Patient is stable with no verbal complaints and no signs or symptoms of distress or discomfort.  

## 2013-03-13 NOTE — Progress Notes (Signed)
Utilization Review Completed Jakaden Ouzts J. Ferron Ishmael, RN, BSN, NCM [phone]  

## 2013-03-14 LAB — BASIC METABOLIC PANEL
CO2: 23 mEq/L (ref 19–32)
Calcium: 8.3 mg/dL — ABNORMAL LOW (ref 8.4–10.5)
Chloride: 101 mEq/L (ref 96–112)
GFR calc Af Amer: 29 mL/min — ABNORMAL LOW (ref >90)
GFR calc non Af Amer: 25 mL/min — ABNORMAL LOW (ref >90)
Sodium: 138 mEq/L (ref 137–147)

## 2013-03-14 LAB — CBC
Hemoglobin: 14.6 g/dL (ref 13.0–17.0)
MCV: 89.5 fL (ref 78.0–100.0)
Platelets: 188 10*3/uL (ref 150–400)
RBC: 4.84 MIL/uL (ref 4.22–5.81)
RDW: 15 % (ref 11.5–15.5)
WBC: 14 10*3/uL — ABNORMAL HIGH (ref 4.0–10.5)

## 2013-03-14 MED ORDER — ALBUTEROL SULFATE (2.5 MG/3ML) 0.083% IN NEBU: 2.5000 mg | INHALATION_SOLUTION | RESPIRATORY_TRACT | Status: DC | PRN

## 2013-03-14 MED ORDER — AZITHROMYCIN 500 MG PO TABS: 500.0000 mg | ORAL_TABLET | ORAL | Status: DC

## 2013-03-14 MED ORDER — PREDNISONE 50 MG PO TABS
60.0000 mg | ORAL_TABLET | Freq: Every day | ORAL | Status: DC
  Filled 2013-03-14: qty 1

## 2013-03-14 MED ORDER — PREDNISONE (PAK) 10 MG PO TABS: ORAL_TABLET | Freq: Every day | ORAL | Status: DC

## 2013-03-14 NOTE — Discharge Summary (Signed)
Physician Discharge Summary  Cody [name] JXB:[id number] DOB: 07/08/1926 DOA: 03/10/2013  PCP: Clydell Hakim, MD  Admit date: 03/10/2013 Discharge date: 03/14/2013  Time spent: 35 minutes  Recommendations for Outpatient Follow-up:  Patient will be discharged to home. He should follow up with his primary care doctor within a few days of discharge to have his laboratory workup including BMP followed. Patient should also follow up with his cardiologist as his Lasix was held due to his acute kidney injury. Patient to continue taking his medications as prescribed.  Discharge Diagnoses:  Principal Problem:   Acute bronchitis with acute respiratory failure, improving   Acute kidney injury on CKD stage III, improving will need followup Active Problems:   Essential hypertension   Chronic diastolic heart failure, NYHA class 2   Leukocytosis, likely acute phase reactants improving   Atrial flutter  Discharge Condition: Stable  Diet recommendation: Cardiac heart healthy diet  Filed Weights   03/12/13 0438 03/13/13 0703 03/14/13 0443  Weight: 93.123 kg (205 lb 4.8 oz) 93.577 kg (206 lb 4.8 oz) 94.666 kg (208 lb 11.2 oz)    History of present illness:  Cody [name] is a 79 y.o. male Past medical history of coronary artery disease status post CABG in 2005, with stenting in 2009, mitral annuloplasty, hypertension pulmonary fibrosis and diastolic heart failure who comes to the [facility] emergency department for shortness of breath that started 3 weeks prior to admission. He relates that at baseline he doesn't use oxygen he could walk about 100 feet without getting short of breath. But now he can he would walk to the bathroom without being short of breath. He relates a new productive cough that is change in color and is thicker. No fevers at home not contacts, he's had his flu and pneumonia vaccine. He relates he wakes up in the middle of the night short of breath so we were consulted  for further evaluation.   Hospital Course:  This is an 79 year old male history of coronary artery disease status post CABG 2005, hypertension pulmonary fibrosis, chronic diastolic heart failure presented to the hospital for shortness of breath that started approximately 3 weeks prior to admission. Patient states that he does not use oxygen at home however was becoming more short of breath with walking around. Patient also had complaints of a productive cough. Patient was admitted for acute respiratory failure with acute bronchitis and leukocytosis. He was placed on azithromycin and Rocephin. His chest x-ray did show asymmetric infiltrate. Patient was also started on IV steroids. Patient's wheezing as well as breathing did improve during his hospital course. He was continued on his albuterol as well as Spiriva. Sputum culture still pending. Patient's blood cultures showed no growth. Patient was weaned from IV steroids onto steroids by mouth. His influenza PCR was negative. Patient was started on Tamiflu empirically however this was discontinued. Patient was noted to have acute on chronic kidney disease stage III. Patient was on Lasix however this was held. Renal ultrasound was also ordered which did not show any hydronephrosis. Nephrology was also consulted.  Patient will need to have his BMP monitored within 3 days of discharge. This was discussed with the patient, he does agree and will follow up with his primary care physician. Patient does have a history of chronic diastolic heart failure NYHA class II. Patient on was on Lasix or organ this was discontinued due to his acute kidney injury. He was placed on fluid restriction along with daily weight monitoring and  his input and output were also monitored. Patient had gained some weight during his hospital course as he does state that he was not eating properly prior to admission. Patient was not noted to be in heart failure exacerbation at this time. Patient  also was found to have leukocytosis upon admission however this is likely an acute phase reactant due to his steroids. Patient will continue taking steroids as an outpatient for his acute bronchitis. Patient does have a history of atrial flutter however this remained rate controlled patient continued his Coreg. Patient does have a history of hypertension, his amlodipine and cord were continued his hypertension did remain stable. Again his Lasix was discontinued. Patient will need followup with his primary care physician as well as his cardiologist due to his medications being held and changed. He will need lab were conducted on 03/17/2013 at his primary care physician's office. His BMP should continue to be monitored. This was discussed with the patient, he does understand and agrees to the plan as stated above.  Procedures: Renal ultrasound: IMPRESSION: Multiple small cysts in the right kidney. No hydronephrosis of  either kidney.  Consultations: Nephrology  Discharge Exam: Filed Vitals:   03/14/13 1043  BP: 111/57  Pulse: 72  Temp:   Resp:    Exam  General: Well developed, well nourished, NAD, appears stated age  HEENT: NCAT, PERRLA, EOMI, Anicteic Sclera, mucous membranes moist.  Neck: Supple, no JVD, no masses  Cardiovascular: S1 S2 auscultated, irregularly irregular  Respiratory: Clear but diminished breath sounds  Abdomen: Soft, nontender, nondistended, + bowel sounds  Extremities: warm dry without cyanosis clubbing.  Trace edema, LE B/L. Neuro: AAOx3, cranial nerves grossly intact.  Skin: Without rashes exudates or nodules  Psych: Normal affect and demeanor with intact judgement and insight  Discharge Instructions  Discharge Orders   Future Orders Complete By Expires   Diet - low sodium heart healthy  As directed    Discharge instructions  As directed    Comments:     Patient will be discharged to home. He should follow up with his primary care doctor within a few days of  discharge to have his laboratory workup including BMP followed. Patient should also follow up with his cardiologist as his Lasix was held due to his acute kidney injury. Patient to continue taking his medications as prescribed.   Increase activity slowly  As directed        Medication List    STOP taking these medications       furosemide 20 MG tablet  Commonly known as:  LASIX     naproxen sodium 220 MG tablet  Commonly known as:  ANAPROX      TAKE these medications       albuterol 108 (90 BASE) MCG/ACT inhaler  Commonly known as:  PROVENTIL HFA;VENTOLIN HFA  Inhale 2 puffs into the lungs every 6 (six) hours as needed for wheezing.     amLODipine 10 MG tablet  Commonly known as:  NORVASC  Take 1 tablet (10 mg total) by mouth daily.     aspirin 81 MG EC tablet  Take 81 mg by mouth daily.     azithromycin 500 MG tablet  Commonly known as:  ZITHROMAX  Take 1 tablet (500 mg total) by mouth daily.     carvedilol 25 MG tablet  Commonly known as:  COREG  Take 12.5 mg by mouth daily.     guaiFENesin 600 MG 12 hr tablet  Commonly known as:  MUCINEX  Take 600 mg by mouth 2 (two) times daily.     menthol-cetylpyridinium 3 MG lozenge  Commonly known as:  CEPACOL  Take 1 lozenge by mouth daily as needed for sore throat.     nitroGLYCERIN 0.4 MG SL tablet  Commonly known as:  NITROSTAT  Place 1 tablet (0.4 mg total) under the tongue every 5 (five) minutes as needed for chest pain.     Omeprazole 20 MG Tbec  Take 20 mg by mouth daily. Take 30-60 min before first meal of the day     simvastatin 40 MG tablet  Commonly known as:  ZOCOR  Take 40 mg by mouth every evening.       Allergies  Allergen Reactions  . Codeine Anxiety       Follow-up Information   Follow up with Clydell Hakim, MD. Schedule an appointment as soon as possible for a visit in 3 days.   Specialty:  Family Medicine   Contact information:   [address] [place]  [address] [phone]       Follow up with VAN Dinah Beers, MD. Schedule an appointment as soon as possible for a visit in 1 week.   Specialty:  Cardiothoracic Surgery   Contact information:   [address] [place] [address] [phone]        The results of significant diagnostics from this hospitalization (including imaging, microbiology, ancillary and laboratory) are listed below for reference.    Significant Diagnostic Studies: Dg Chest 2 View  03/10/2013   CLINICAL DATA:  Shortness of breath, weakness, hypertension, prior coronary bypass and mitral valve replacement  EXAM: CHEST  2 VIEW  COMPARISON:  10/21/2012  FINDINGS: Prior coronary bypass and mitral valve replacement noted. Heart is enlarged. Diffuse asymmetric patchy airspace process with small effusions. Pattern is nonspecific the can be seen with asymmetric edema related CHF versus pneumonia. No pneumothorax. Trachea is midline. Atherosclerosis of the aorta.  IMPRESSION: Bilateral asymmetric airspace process versus edema with small effusions.   Electronically Signed   By: Ruel Favors M.D.   On: 03/10/2013 12:24   US Renal  03/14/2013   CLINICAL DATA:  Increasing creatinine.  EXAM: RENAL/URINARY TRACT ULTRASOUND COMPLETE  COMPARISON:  10/23/2005  FINDINGS: Right Kidney:  Length: 10.5 cm. Mild diffuse parenchymal atrophy. Small cysts in the upper pole, measuring 1.3 x 1.8 and 1.1 x 1.1 cm. Small cyst in the lower pole measuring about 1.5 cm diameter. No hydronephrosis.  Left Kidney:  Length: 11 cm. Echogenicity within normal limits. No mass or hydronephrosis visualized.  Bladder:  No bladder wall thickening or filling defect.  IMPRESSION: Multiple small cysts in the right kidney. No hydronephrosis of either kidney.   Electronically Signed   By: Burman Nieves M.D.   On: 03/14/2013 02:03    Microbiology: Recent Results (from the past 240 hour(s))  CULTURE, BLOOD (ROUTINE X 2)     Status: None    Collection Time    03/10/13  5:05 PM      Result Value Range Status   Specimen Description BLOOD ARM RIGHT   Final   Special Requests BOTTLES DRAWN AEROBIC ONLY 10CC   Final   Culture  Setup Time     Final   Value: 03/10/2013 20:43     Performed at [facility]   Culture     Final   Value:        BLOOD CULTURE RECEIVED NO GROWTH TO  DATE CULTURE WILL BE HELD FOR 5 DAYS BEFORE ISSUING A FINAL NEGATIVE REPORT     Performed at [facility]   Report Status PENDING   Incomplete  CULTURE, BLOOD (ROUTINE X 2)     Status: None   Collection Time    03/10/13  5:10 PM      Result Value Range Status   Specimen Description BLOOD HAND RIGHT   Final   Special Requests BOTTLES DRAWN AEROBIC AND ANAEROBIC 10CC   Final   Culture  Setup Time     Final   Value: 03/10/2013 20:44     Performed at [facility]   Culture     Final   Value:        BLOOD CULTURE RECEIVED NO GROWTH TO DATE CULTURE WILL BE HELD FOR 5 DAYS BEFORE ISSUING A FINAL NEGATIVE REPORT     Performed at [facility]   Report Status PENDING   Incomplete     Labs: Basic Metabolic Panel:  Recent Labs Lab 03/10/13 1325 03/11/13 0423 03/12/13 1110 03/12/13 1245 03/13/13 0530 03/14/13 0618  NA 138 138 133*  --  137 138  K 4.7 4.4 4.4  --  4.4 4.8  CL 100 102 97  --  101 101  CO2 27 25 20   --  22 23  GLUCOSE 103* 155* 141*  --  149* 156*  BUN 29* 32* 53*  --  67* 72*  CREATININE 1.77* 1.96* 2.40*  --  2.65* 2.28*  CALCIUM 9.3 8.8 9.0  --  8.7 8.3*  MG  --   --   --  2.1  --   --   PHOS  --   --   --  4.0  --   --    Liver Function Tests:  Recent Labs Lab 03/11/13 0423  AST 13  ALT 6  ALKPHOS 58  BILITOT 0.4  PROT 6.9  ALBUMIN 3.3*   No results found for this basename: LIPASE, AMYLASE,  in the last 168 hours No results found for this basename: AMMONIA,  in the last 168 hours CBC:  Recent Labs Lab 03/10/13 1325 03/11/13 0423 03/13/13 0530 03/14/13 0618  WBC 11.6* 7.8 16.7* 14.0*   HGB 17.3* 15.6 14.5 14.6  HCT 50.5 47.3 43.4 43.3  MCV 90.0 89.6 89.5 89.5  PLT 205 191 188 188   Cardiac Enzymes:  Recent Labs Lab 03/10/13 1325  TROPONINI <0.30   BNP: BNP (last 3 results)  Recent Labs  10/21/12 0944 03/10/13 1325  PROBNP 1420.0* 2108.0*   CBG: No results found for this basename: GLUCAP,  in the last 168 hours     Signed:  Edsel Petrin  [facility] 03/14/2013, 11:48 AM

## 2013-03-14 NOTE — Progress Notes (Signed)
DC IV, DC Tele, DC Home. Discharge instructions and home medications discussed with patient and patient's wife. Patient and wife denied any questions of concern. Patient leaving unit via wheelchair and appears in no acute distress.

## 2013-03-14 NOTE — Progress Notes (Signed)
Assessment:  1 AKI probably hemodynamically mediated, improved  2 Stage 3 CKD Plan Serial monitoring of renal fct  Subjective: Interval History: Breathing Ok  Objective: Vital signs in last 24 hours: Temp:  [97.1 F (36.2 C)-97.5 F (36.4 C)] 97.3 F (36.3 C) (12/30 0443) Pulse Rate:  [64-82] 64 (12/30 0443) Resp:  [18-20] 18 (12/30 0443) BP: (94-120)/(54-80) 94/58 mmHg (12/30 0443) SpO2:  [94 %-97 %] 95 % (12/30 0443) Weight:  [94.666 kg (208 lb 11.2 oz)] 94.666 kg (208 lb 11.2 oz) (12/30 0443) Weight change:   Intake/Output from previous day: 12/29 0701 - 12/30 0700 In: 843 [P.O.:840; I.V.:3] Out: 800 [Urine:800] Intake/Output this shift: Total I/O In: 600 [P.O.:600] Out: 250 [Urine:250]  General appearance: alert and cooperative Resp: occ wheeze and decreased clear Chest wall: no tenderness Cardio: regular rate and rhythm, S1, S2 normal, no murmur, click, rub or gallop and occ extra beats Extremities: edema trace  Lab Results:  Recent Labs  03/13/13 0530 03/14/13 0618  WBC 16.7* 14.0*  HGB 14.5 14.6  HCT 43.4 43.3  PLT 188 188   BMET:  Recent Labs  03/13/13 0530 03/14/13 0618  NA 137 138  K 4.4 4.8  CL 101 101  CO2 22 23  GLUCOSE 149* 156*  BUN 67* 72*  CREATININE 2.65* 2.28*  CALCIUM 8.7 8.3*   No results found for this basename: PTH,  in the last 72 hours Iron Studies: No results found for this basename: IRON, TIBC, TRANSFERRIN, FERRITIN,  in the last 72 hours Studies/Results: US Renal  03/14/2013   CLINICAL DATA:  Increasing creatinine.  EXAM: RENAL/URINARY TRACT ULTRASOUND COMPLETE  COMPARISON:  10/23/2005  FINDINGS: Right Kidney:  Length: 10.5 cm. Mild diffuse parenchymal atrophy. Small cysts in the upper pole, measuring 1.3 x 1.8 and 1.1 x 1.1 cm. Small cyst in the lower pole measuring about 1.5 cm diameter. No hydronephrosis.  Left Kidney:  Length: 11 cm. Echogenicity within normal limits. No mass or hydronephrosis visualized.  Bladder:  No  bladder wall thickening or filling defect.  IMPRESSION: Multiple small cysts in the right kidney. No hydronephrosis of either kidney.   Electronically Signed   By: Burman Nieves M.D.   On: 03/14/2013 02:03   Scheduled: . amLODipine  10 mg Oral Daily  . aspirin  81 mg Oral Daily  . atorvastatin  20 mg Oral q1800  . azithromycin  500 mg Oral Q24H  . carvedilol  12.5 mg Oral Daily  . cefTRIAXone (ROCEPHIN)  IV  1 g Intravenous Q24H  . guaiFENesin  600 mg Oral BID  . heparin  5,000 Units Subcutaneous Q8H  . pantoprazole  40 mg Oral Daily  . [START ON 03/15/2013] predniSONE  60 mg Oral Q breakfast  . sodium chloride  3 mL Intravenous Q12H      LOS: 4 days   Tekoa Hamor C 03/14/2013,9:01 AM

## 2013-03-16 LAB — CULTURE, BLOOD (ROUTINE X 2)
Culture: NO GROWTH
Culture: NO GROWTH

## 2013-03-20 ENCOUNTER — Ambulatory Visit (INDEPENDENT_AMBULATORY_CARE_PROVIDER_SITE_OTHER): Payer: Medicare Other | Admitting: Cardiovascular Disease

## 2013-03-20 ENCOUNTER — Encounter: Payer: Self-pay | Admitting: Cardiovascular Disease

## 2013-03-20 VITALS — BP 90/60 | HR 70 | Ht 74.0 in | Wt 207.0 lb

## 2013-03-20 DIAGNOSIS — I5032 Chronic diastolic (congestive) heart failure: Secondary | ICD-10-CM

## 2013-03-20 DIAGNOSIS — I10 Essential (primary) hypertension: Secondary | ICD-10-CM

## 2013-03-20 DIAGNOSIS — I4892 Unspecified atrial flutter: Secondary | ICD-10-CM

## 2013-03-20 DIAGNOSIS — I2510 Atherosclerotic heart disease of native coronary artery without angina pectoris: Secondary | ICD-10-CM

## 2013-03-20 DIAGNOSIS — J209 Acute bronchitis, unspecified: Secondary | ICD-10-CM

## 2013-03-20 DIAGNOSIS — R0602 Shortness of breath: Secondary | ICD-10-CM

## 2013-03-20 MED ORDER — APIXABAN 5 MG PO TABS: 5.0000 mg | ORAL_TABLET | Freq: Two times a day (BID) | ORAL | Status: DC

## 2013-03-20 NOTE — Progress Notes (Signed)
Patient ID: Cody [name], male    DOB: [birth year], 80 y.o.   MRN: [id number]  HPI Comments: 80 year old male with a history of coronary artery disease, bypass surgery in 08/14/2003, peripheral vascular disease with right carotid endarterectomy for 80% lesion, PCI of his left circumflex in 08-14-2007 and PTCA of an OM1 at the same time, history of hypertension, hyperlipidemia who presents for followup. long history of exposure to asphalt, previous smoking history (smoked for 25 years,stopped in 08/13/1965).  daughter  passed away from brain cancer.he was told by cardiothoracic surgeon at the time of his bypass that he had severe "black lungs".  pneumonia in December 2012 and in November 2013.   he has mild chronic shortness of breath. On today's visit he reports being admitted to the hospital the day after Christmas 2014 with shortness of breath. He was managed by the hospitalist service.  He was treated with antibiotics, prednisone, diuretics. He had worsening renal function with a creatinine 2.6. Lasix was held. EKG showed atrial flutter which was new. Rate was relatively well-controlled. He was not started on anticoagulation.   In followup today, he reports that he has continued weakness, wife is taking care of him at home, doing everything. Still has shortness of breath with exertion. No chest tightness or chest pain. He is holding his Lasix since discharge from the hospital  Previous Echocardiogram showed normal systolic function with mildly elevated right ventricular systolic pressures.   Cardiac cath November 28 2007 showed that the LIMA to the LAD was patent.  The saphenous vein graft to the diagonal was patent.  The right coronary artery was nondominant.  The circumflex had a 90% stenosis of the takeoff of OM1.  The takeoff of the OM2 had a subtotal occlusion. The jump graft between the OM2 and distal circumflex were patent.  He underwent successful PTCA and stenting of the circumflex and a PTCA of the OM1.     Carotid ultrasound in November 2011 showing 40-59% carotid disease on the left    EKG  shows atrial flutter with ventricular rate 70 beats per minute. This is a change from normal sinus rhythm on his last clinic visit.    Outpatient Encounter Prescriptions as of 03/20/2013  Medication Sig  . albuterol (PROVENTIL HFA;VENTOLIN HFA) 108 (90 BASE) MCG/ACT inhaler Inhale 2 puffs into the lungs every 6 (six) hours as needed for wheezing.  Marland Kitchen amLODipine (NORVASC) 10 MG tablet Take 1 tablet (10 mg total) by mouth daily.  Marland Kitchen aspirin 81 MG EC tablet Take 81 mg by mouth daily.    . carvedilol (COREG) 25 MG tablet Take 12.5 mg by mouth daily.  Marland Kitchen guaiFENesin (MUCINEX) 600 MG 12 hr tablet Take 600 mg by mouth 2 (two) times daily.  Marland Kitchen menthol-cetylpyridinium (CEPACOL) 3 MG lozenge Take 1 lozenge by mouth daily as needed for sore throat.  . nitroGLYCERIN (NITROSTAT) 0.4 MG SL tablet Place 1 tablet (0.4 mg total) under the tongue every 5 (five) minutes as needed for chest pain.  Marland Kitchen Omeprazole 20 MG TBEC Take 20 mg by mouth daily. Take 30-60 min before first meal of the day  . predniSONE (STERAPRED UNI-PAK) 10 MG tablet Take by mouth daily. Prednisone dosing: Take  Prednisone 50mg  (5 tabs) x 2 days, then taper to 40mg  (4 tabs) x 2 days, then 30mg  (3 tabs) x 2days, then 20mg  (2 tabs) x 2days, then 10mg  (1 tab) x 2days, then STOP.  Marland Kitchen simvastatin (ZOCOR) 40 MG tablet Take 40 mg  by mouth every evening.  . [DISCONTINUED] azithromycin (ZITHROMAX) 500 MG tablet Take 1 tablet (500 mg total) by mouth daily.    Review of Systems  Constitutional: Positive for fatigue.  HENT: Negative.   Eyes: Negative.   Respiratory: Positive for shortness of breath.        Chronic  Cardiovascular: Negative.   Gastrointestinal: Negative.   Endocrine: Negative.   Musculoskeletal: Negative.   Skin: Negative.   Allergic/Immunologic: Negative.   Neurological: Positive for weakness.  Hematological: Negative.    Psychiatric/Behavioral: Negative.   All other systems reviewed and are negative.   BP 90/60  Pulse 70  Ht 6\' 2"  (1.88 m)  Wt 207 lb (93.895 kg)  BMI 26.57 kg/m2  Physical Exam  Nursing note and vitals reviewed. Constitutional: He is oriented to person, place, and time. He appears well-developed and well-nourished.  HENT:  Head: Normocephalic.  Nose: Nose normal.  Mouth/Throat: Oropharynx is clear and moist.  Eyes: Conjunctivae are normal. Pupils are equal, round, and reactive to light.  Neck: Normal range of motion. Neck supple. No JVD present.  Cardiovascular: Normal rate, S1 normal, S2 normal, normal heart sounds and intact distal pulses.  An irregularly irregular rhythm present. Exam reveals no gallop and no friction rub.   No murmur heard. Pulmonary/Chest: Effort normal. No respiratory distress. He has decreased breath sounds in the right lower field and the left lower field. He has no wheezes. He has rales. He exhibits no tenderness.  Rales at the bases bilaterally  Abdominal: Soft. Bowel sounds are normal. He exhibits no distension. There is no tenderness.  Musculoskeletal: Normal range of motion. He exhibits no edema and no tenderness.  Lymphadenopathy:    He has no cervical adenopathy.  Neurological: He is alert and oriented to person, place, and time. Coordination normal.  Skin: Skin is warm and dry. No rash noted. No erythema.  Psychiatric: He has a normal mood and affect. His behavior is normal. Judgment and thought content normal.      Assessment and Plan

## 2013-03-20 NOTE — Assessment & Plan Note (Signed)
New atrial flutter starting in mid December around the time of his bronchitis. He was not started on anticoagulation on recent hospital visit. We will start him on eliquis 5 mg twice a day. He has shortness of breath which could be secondary to underlying new atrial flutter. If he is able to tolerate 4 weeks of anticoagulation, we will plan for cardioversion at that time. This would likely help his shortness of breath

## 2013-03-20 NOTE — Assessment & Plan Note (Addendum)
Suggested he continue to hold his Lasix. Renal dysfunction on recent admission likely from moderate dehydration

## 2013-03-20 NOTE — Patient Instructions (Addendum)
Please start eliquis 5 mg twice a day (blood thinner) for atrial flutter If you get worsening energy, shortness of breath, call the office  Take lasix only for worsening leg swelling  Please call us if you have new issues that need to be addressed before your next appt.  Your physician wants you to follow-up in: 4 weeks

## 2013-03-20 NOTE — Assessment & Plan Note (Signed)
Blood pressure is well controlled on today's visit. No changes made to the medications. He

## 2013-03-20 NOTE — Assessment & Plan Note (Signed)
Currently with no symptoms of angina. No further workup at this time. Continue current medication regimen. 

## 2013-03-20 NOTE — Assessment & Plan Note (Signed)
Appears to be doing better slowly though still weak. If still not improving by next week, we have asked him to call he office

## 2013-03-22 ENCOUNTER — Other Ambulatory Visit: Payer: Self-pay

## 2013-03-22 MED ORDER — APIXABAN 5 MG PO TABS: 5.0000 mg | ORAL_TABLET | Freq: Two times a day (BID) | ORAL | Status: DC

## 2013-03-22 NOTE — Telephone Encounter (Signed)
Refill eliquis

## 2013-03-29 ENCOUNTER — Telehealth: Payer: Self-pay | Admitting: *Deleted

## 2013-03-29 NOTE — Telephone Encounter (Signed)
Prior Authorization required for Eliquis 5 mg bid.  Faxed formed to [facility] awaiting approval. [phone]

## 2013-03-30 NOTE — Telephone Encounter (Signed)
Patient has been approved for Eliquis until 05/03/2013.

## 2013-04-24 ENCOUNTER — Encounter: Payer: Self-pay | Admitting: Cardiovascular Disease

## 2013-04-24 ENCOUNTER — Ambulatory Visit (INDEPENDENT_AMBULATORY_CARE_PROVIDER_SITE_OTHER): Payer: Medicare Other | Admitting: Cardiovascular Disease

## 2013-04-24 VITALS — BP 100/54 | HR 69 | Ht 74.0 in | Wt 204.5 lb

## 2013-04-24 DIAGNOSIS — I10 Essential (primary) hypertension: Secondary | ICD-10-CM

## 2013-04-24 DIAGNOSIS — N189 Chronic kidney disease, unspecified: Secondary | ICD-10-CM

## 2013-04-24 DIAGNOSIS — R0602 Shortness of breath: Secondary | ICD-10-CM

## 2013-04-24 DIAGNOSIS — I4892 Unspecified atrial flutter: Secondary | ICD-10-CM

## 2013-04-24 DIAGNOSIS — I2510 Atherosclerotic heart disease of native coronary artery without angina pectoris: Secondary | ICD-10-CM

## 2013-04-24 DIAGNOSIS — I5033 Acute on chronic diastolic (congestive) heart failure: Secondary | ICD-10-CM

## 2013-04-24 DIAGNOSIS — I509 Heart failure, unspecified: Secondary | ICD-10-CM

## 2013-04-24 DIAGNOSIS — R0789 Other chest pain: Secondary | ICD-10-CM

## 2013-04-24 DIAGNOSIS — I25810 Atherosclerosis of coronary artery bypass graft(s) without angina pectoris: Secondary | ICD-10-CM

## 2013-04-24 DIAGNOSIS — E785 Hyperlipidemia, unspecified: Secondary | ICD-10-CM

## 2013-04-24 NOTE — Assessment & Plan Note (Signed)
We will check basic metabolic panel today. I suspect he is dehydrated. Previously recommended Lasix only for leg edema or weight gain. He has been taking this now for shortness of breath only. He does report swelling in his neck recently.

## 2013-04-24 NOTE — Patient Instructions (Addendum)
You are doing well. No medication changes were made.  We will check a BMP today  Your physician has requested that you have an echocardiogram. Echocardiography is a painless test that uses sound waves to create images of your heart. It provides your doctor with information about the size and shape of your heart and how well your heart's chambers and valves are working. This procedure takes approximately one hour. There are no restrictions for this procedure.  Please call us if you have new issues that need to be addressed before your next appt.  Your physician wants you to follow-up in: 1 months.   [facility] MYOVIEW  Your caregiver has ordered a Stress Test with nuclear imaging. The purpose of this test is to evaluate the blood supply to your heart muscle. This procedure is referred to as a "Non-Invasive Stress Test." This is because other than having an IV started in your vein, nothing is inserted or "invades" your body. Cardiac stress tests are done to find areas of poor blood flow to the heart by determining the extent of coronary artery disease (CAD). Some patients exercise on a treadmill, which naturally increases the blood flow to your heart, while others who are  unable to walk on a treadmill due to physical limitations have a pharmacologic/chemical stress agent called Lexiscan . This medicine will mimic walking on a treadmill by temporarily increasing your coronary blood flow.   Please note: these test may take anywhere between 2-4 hours to complete  [facility]  THE VOLUNTEERS AT THE FIRST DESK WILL DIRECT YOU WHERE TO GO  Date of Procedure:____Wednesday, Feb 11_________________  Arrival Time for Procedure:_______7:00am_________________  Instructions regarding medication:   __X__  Please do not take CARVEDILOL the night before or the morning of the test.  PLEASE NOTIFY THE OFFICE AT LEAST 24 HOURS IN ADVANCE IF YOU ARE UNABLE TO KEEP YOUR APPOINTMENT.   [phone] AND  PLEASE NOTIFY NUCLEAR MEDICINE AT [facility] AT LEAST 24 HOURS IN ADVANCE IF YOU ARE UNABLE TO KEEP YOUR APPOINTMENT. [phone]  How to prepare for your Myoview test:  1. Do not eat or drink after midnight 2. No caffeine for 24 hours prior to test 3. No smoking 24 hours prior to test. 4. Your medication may be taken with water.  If your doctor stopped a medication because of this test, do not take that medication. 5. Ladies, please do not wear dresses.  Skirts or pants are appropriate. Please wear a short sleeve shirt. 6. No perfume, cologne or lotion. 7. Wear comfortable walking shoes. No heels!

## 2013-04-24 NOTE — Assessment & Plan Note (Signed)
Etiology not clear. Unable to exclude underlying pulmonary issue, though concerning for cardiac ischemia. Unable to do cardiac catheterization given renal dysfunction. We have recommended he have a stress test to rule out ischemia an echocardiogram to evaluate right ventricular systolic pressures. If no clear cardiac issue causing shortness of breath, would recommend referral to pulmonary

## 2013-04-24 NOTE — Assessment & Plan Note (Signed)
He has converted back to normal sinus rhythm. We'll continue him on anticoagulation for now

## 2013-04-24 NOTE — Assessment & Plan Note (Signed)
Blood pressure is well controlled on today's visit. No changes made to the medications. 

## 2013-04-24 NOTE — Progress Notes (Signed)
Patient ID: Cody [name], male    DOB: 10-Jan-1927, 80 y.o.   MRN: [id number]  HPI Comments: 80 year old male with a history of coronary artery disease, bypass surgery in 2005, peripheral vascular disease with right carotid endarterectomy for 80% lesion, PCI of his left circumflex in 2009 and PTCA of an OM1 at the same time, history of hypertension, hyperlipidemia who presents for followup.   long history of exposure to asphalt, previous smoking history (smoked for 25 years,stopped in 1967).  daughter  passed away from brain cancer.he was told by cardiothoracic surgeon at the time of his bypass that he had severe "black lungs". pneumonia in December 2012 and in November 2013. Previously seen chronic shortness of breath.  admitted to the hospital the day after Christmas 2014 with shortness of breath.  treated with antibiotics, prednisone, diuretics. He had worsening renal function with a creatinine 2.6. Lasix was held. EKG showed atrial flutter which was new. Rate was relatively well-controlled. He was not started on anticoagulation.  In follow up in clinic, we started him on anticoagulation. In followup today, he reports shortness of breath has been worse over the past several months. He is concerned about cardiac issues causing his shortness of breath symptoms. He continues to take Lasix. He denies any lower extremity edema Prior creatinine on 03/14/2013   2.6  Previous Echocardiogram showed normal systolic function with mildly elevated right ventricular systolic pressures.   Cardiac cath November 28 2007 showed that the LIMA to the LAD was patent.  The saphenous vein graft to the diagonal was patent.  The right coronary artery was nondominant.  The circumflex had a 90% stenosis of the takeoff of OM1.  The takeoff of the OM2 had a subtotal occlusion. The jump graft between the OM2 and distal circumflex were patent.  He underwent successful PTCA and stenting of the circumflex and a PTCA of the  OM1.    Carotid ultrasound in November 2011 showing 40-59% carotid disease on the left    EKG  shows normal sinus rhythm with rate 69 beats per minute, right bundle branch block    Outpatient Encounter Prescriptions as of 04/24/2013  Medication Sig  . amLODipine (NORVASC) 10 MG tablet Take 1 tablet (10 mg total) by mouth daily.  Marland Kitchen. apixaban (ELIQUIS) 5 MG TABS tablet Take 1 tablet (5 mg total) by mouth 2 (two) times daily.  Marland Kitchen. aspirin 81 MG EC tablet Take 81 mg by mouth daily.    . carvedilol (COREG) 25 MG tablet Take 12.5 mg by mouth daily.  Marland Kitchen. guaiFENesin (MUCINEX) 600 MG 12 hr tablet Take 600 mg by mouth 2 (two) times daily.  Marland Kitchen. menthol-cetylpyridinium (CEPACOL) 3 MG lozenge Take 1 lozenge by mouth daily as needed for sore throat.  . nitroGLYCERIN (NITROSTAT) 0.4 MG SL tablet Place 1 tablet (0.4 mg total) under the tongue every 5 (five) minutes as needed for chest pain.  Marland Kitchen. Omeprazole 20 MG TBEC Take 20 mg by mouth daily. Take 30-60 min before first meal of the day  . simvastatin (ZOCOR) 40 MG tablet Take 40 mg by mouth every evening.  . [DISCONTINUED] albuterol (PROVENTIL HFA;VENTOLIN HFA) 108 (90 BASE) MCG/ACT inhaler Inhale 2 puffs into the lungs every 6 (six) hours as needed for wheezing.  . [DISCONTINUED] predniSONE (STERAPRED UNI-PAK) 10 MG tablet Take by mouth daily. Prednisone dosing: Take  Prednisone 50mg  (5 tabs) x 2 days, then taper to 40mg  (4 tabs) x 2 days, then 30mg  (3 tabs) x 2days, then  20mg  (2 tabs) x 2days, then 10mg  (1 tab) x 2days, then STOP.    Review of Systems  HENT: Negative.   Eyes: Negative.   Respiratory: Positive for shortness of breath.        Chronic  Cardiovascular: Negative.   Gastrointestinal: Negative.   Endocrine: Negative.   Musculoskeletal: Negative.   Skin: Negative.   Allergic/Immunologic: Negative.   Hematological: Negative.   Psychiatric/Behavioral: Negative.   All other systems reviewed and are negative.   BP 100/54  Pulse 69  Ht 6\' 2"   (1.88 m)  Wt 204 lb 8 oz (92.761 kg)  BMI 26.25 kg/m2  Physical Exam  Nursing note and vitals reviewed. Constitutional: He is oriented to person, place, and time. He appears well-developed and well-nourished.  HENT:  Head: Normocephalic.  Nose: Nose normal.  Mouth/Throat: Oropharynx is clear and moist.  Eyes: Conjunctivae are normal. Pupils are equal, round, and reactive to light.  Neck: Normal range of motion. Neck supple. No JVD present.  Cardiovascular: Normal rate, S1 normal, S2 normal, normal heart sounds and intact distal pulses.  An irregularly irregular rhythm present. Exam reveals no gallop and no friction rub.   No murmur heard. Pulmonary/Chest: Effort normal. No respiratory distress. He has decreased breath sounds in the right lower field and the left lower field. He has no wheezes. He has rales. He exhibits no tenderness.  Rales at the bases bilaterally  Abdominal: Soft. Bowel sounds are normal. He exhibits no distension. There is no tenderness.  Musculoskeletal: Normal range of motion. He exhibits no edema and no tenderness.  Lymphadenopathy:    He has no cervical adenopathy.  Neurological: He is alert and oriented to person, place, and time. Coordination normal.  Skin: Skin is warm and dry. No rash noted. No erythema.  Psychiatric: He has a normal mood and affect. His behavior is normal. Judgment and thought content normal.      Assessment and Plan

## 2013-04-24 NOTE — Assessment & Plan Note (Signed)
Stress test and echocardiogram ordered for worsening shortness of breath. Unable to exclude unstable angina, ischemia as a cause of his symptoms

## 2013-04-24 NOTE — Assessment & Plan Note (Signed)
Recommended that he stay on his simvastatin 

## 2013-04-26 ENCOUNTER — Ambulatory Visit: Payer: Self-pay | Admitting: Cardiovascular Disease

## 2013-04-26 DIAGNOSIS — R079 Chest pain, unspecified: Secondary | ICD-10-CM

## 2013-04-26 LAB — BASIC METABOLIC PANEL
BUN/Creatinine Ratio: 17 (ref 10–22)
BUN: 36 mg/dL — AB (ref 8–27)
CHLORIDE: 97 mmol/L (ref 97–108)
CO2: 20 mmol/L (ref 18–29)
CREATININE: 2.16 mg/dL — AB (ref 0.76–1.27)
Calcium: 9.5 mg/dL (ref 8.6–10.2)
GFR calc non Af Amer: 27 mL/min/{1.73_m2} — ABNORMAL LOW (ref >59)
GFR, EST AFRICAN AMERICAN: 31 mL/min/{1.73_m2} — AB (ref >59)
Glucose: 68 mg/dL (ref 65–99)
Potassium: 6.3 mmol/L — ABNORMAL HIGH (ref 3.5–5.2)
Sodium: 145 mmol/L — ABNORMAL HIGH (ref 134–144)

## 2013-04-27 ENCOUNTER — Other Ambulatory Visit: Payer: Self-pay

## 2013-04-27 DIAGNOSIS — N189 Chronic kidney disease, unspecified: Secondary | ICD-10-CM

## 2013-04-27 DIAGNOSIS — R0789 Other chest pain: Secondary | ICD-10-CM

## 2013-04-27 DIAGNOSIS — R0602 Shortness of breath: Secondary | ICD-10-CM

## 2013-04-27 DIAGNOSIS — I25810 Atherosclerosis of coronary artery bypass graft(s) without angina pectoris: Secondary | ICD-10-CM

## 2013-05-04 ENCOUNTER — Other Ambulatory Visit: Payer: Self-pay

## 2013-05-04 ENCOUNTER — Other Ambulatory Visit (INDEPENDENT_AMBULATORY_CARE_PROVIDER_SITE_OTHER): Payer: Medicare Other

## 2013-05-04 DIAGNOSIS — I509 Heart failure, unspecified: Secondary | ICD-10-CM

## 2013-05-04 DIAGNOSIS — I059 Rheumatic mitral valve disease, unspecified: Secondary | ICD-10-CM

## 2013-05-04 DIAGNOSIS — R0609 Other forms of dyspnea: Secondary | ICD-10-CM

## 2013-05-04 DIAGNOSIS — R0602 Shortness of breath: Secondary | ICD-10-CM

## 2013-05-04 DIAGNOSIS — I25810 Atherosclerosis of coronary artery bypass graft(s) without angina pectoris: Secondary | ICD-10-CM

## 2013-05-04 DIAGNOSIS — R0989 Other specified symptoms and signs involving the circulatory and respiratory systems: Secondary | ICD-10-CM

## 2013-05-04 DIAGNOSIS — R0789 Other chest pain: Secondary | ICD-10-CM

## 2013-05-04 DIAGNOSIS — I2510 Atherosclerotic heart disease of native coronary artery without angina pectoris: Secondary | ICD-10-CM

## 2013-05-04 DIAGNOSIS — N189 Chronic kidney disease, unspecified: Secondary | ICD-10-CM

## 2013-05-22 ENCOUNTER — Encounter: Payer: Self-pay | Admitting: Cardiovascular Disease

## 2013-05-22 ENCOUNTER — Ambulatory Visit (INDEPENDENT_AMBULATORY_CARE_PROVIDER_SITE_OTHER): Payer: Medicare Other | Admitting: Cardiovascular Disease

## 2013-05-22 ENCOUNTER — Telehealth: Payer: Self-pay | Admitting: Internal Medicine

## 2013-05-22 VITALS — BP 112/72 | HR 66 | Ht 74.0 in | Wt 207.5 lb

## 2013-05-22 DIAGNOSIS — I2510 Atherosclerotic heart disease of native coronary artery without angina pectoris: Secondary | ICD-10-CM

## 2013-05-22 DIAGNOSIS — I4892 Unspecified atrial flutter: Secondary | ICD-10-CM

## 2013-05-22 DIAGNOSIS — I10 Essential (primary) hypertension: Secondary | ICD-10-CM

## 2013-05-22 DIAGNOSIS — I509 Heart failure, unspecified: Secondary | ICD-10-CM

## 2013-05-22 DIAGNOSIS — I5033 Acute on chronic diastolic (congestive) heart failure: Secondary | ICD-10-CM

## 2013-05-22 DIAGNOSIS — R0602 Shortness of breath: Secondary | ICD-10-CM

## 2013-05-22 NOTE — Assessment & Plan Note (Signed)
No further arrhythmia. Holding normal sinus rhythm. No changes to his medications

## 2013-05-22 NOTE — Telephone Encounter (Signed)
Fine by me 

## 2013-05-22 NOTE — Telephone Encounter (Signed)
Please advise Dr. McQuaid thanks 

## 2013-05-22 NOTE — Telephone Encounter (Signed)
Fine with me

## 2013-05-22 NOTE — Assessment & Plan Note (Signed)
Blood pressure is well controlled on today's visit. No changes made to the medications. 

## 2013-05-22 NOTE — Progress Notes (Signed)
Patient ID: Cody [name], male    DOB: 09-Oct-1926, 80 y.o.   MRN: [id number]  HPI Comments: 80 year old male with a history of coronary artery disease, bypass surgery in 2005, peripheral vascular disease with right carotid endarterectomy for 80% lesion, PCI of his left circumflex in 2009 and PTCA of an OM1 at the same time, history of hypertension, hyperlipidemia who presents for followup.   long history of exposure to asphalt, previous smoking history (smoked for 25 years,stopped in 1967).  daughter  passed away from brain cancer.he was told by cardiothoracic surgeon at the time of his bypass that he had severe "black lungs". pneumonia in December 2012 and in November 2013.  chronic shortness of breath, told that he qualifies for oxygen in the past but does not have oxygen at home  admitted to the hospital the day after Christmas 2014 with shortness of breath.  treated with antibiotics, prednisone, diuretics. He had worsening renal function with a creatinine 2.6. Lasix was held. EKG showed atrial flutter which was new. Rate was relatively well-controlled. He was not started on anticoagulation.  In follow up in clinic, we started him on anticoagulation.  Previous workup for shortness of breath including echocardiogram and stress test. Echocardiogram was essentially normal, no ischemia on stress test He takes Lasix periodically for shortness of breath and weight gain  Cardiac cath November 28 2007 showed that the LIMA to the LAD was patent.  The saphenous vein graft to the diagonal was patent.  The right coronary artery was nondominant.  The circumflex had a 90% stenosis of the takeoff of OM1.  The takeoff of the OM2 had a subtotal occlusion. The jump graft between the OM2 and distal circumflex were patent.  He underwent successful PTCA and stenting of the circumflex and a PTCA of the OM1.    Carotid ultrasound in November 2011 showing 40-59% carotid disease on the left    EKG  shows normal  sinus rhythm with rate 84 beats per minute, right bundle branch block    Outpatient Encounter Prescriptions as of 05/22/2013  Medication Sig  . amLODipine (NORVASC) 10 MG tablet Take 1 tablet (10 mg total) by mouth daily.  Marland Kitchen. aspirin 81 MG EC tablet Take 81 mg by mouth daily.    . carvedilol (COREG) 25 MG tablet Take 12.5 mg by mouth daily.  . furosemide (LASIX) 20 MG tablet Take 20 mg by mouth daily as needed.  Marland Kitchen. guaiFENesin (MUCINEX) 600 MG 12 hr tablet Take 600 mg by mouth 2 (two) times daily.  Marland Kitchen. menthol-cetylpyridinium (CEPACOL) 3 MG lozenge Take 1 lozenge by mouth daily as needed for sore throat.  . nitroGLYCERIN (NITROSTAT) 0.4 MG SL tablet Place 1 tablet (0.4 mg total) under the tongue every 5 (five) minutes as needed for chest pain.  Marland Kitchen. Omeprazole 20 MG TBEC Take 20 mg by mouth daily. Take 30-60 min before first meal of the day  . simvastatin (ZOCOR) 40 MG tablet Take 40 mg by mouth every evening.  . [DISCONTINUED] apixaban (ELIQUIS) 5 MG TABS tablet Take 1 tablet (5 mg total) by mouth 2 (two) times daily.    Review of Systems  Constitutional: Negative.   HENT: Negative.   Eyes: Negative.   Respiratory: Positive for shortness of breath.        Chronic  Cardiovascular: Negative.   Gastrointestinal: Negative.   Endocrine: Negative.   Musculoskeletal: Negative.   Skin: Negative.   Allergic/Immunologic: Negative.   Neurological: Negative.   Hematological: Negative.  Psychiatric/Behavioral: Negative.   All other systems reviewed and are negative.   BP 112/72  Pulse 66  Ht 6\' 2"  (1.88 m)  Wt 207 lb 8 oz (94.121 kg)  BMI 26.63 kg/m2  Physical Exam  Nursing note and vitals reviewed. Constitutional: He is oriented to person, place, and time. He appears well-developed and well-nourished.  HENT:  Head: Normocephalic.  Nose: Nose normal.  Mouth/Throat: Oropharynx is clear and moist.  Eyes: Conjunctivae are normal. Pupils are equal, round, and reactive to light.  Neck:  Normal range of motion. Neck supple. No JVD present.  Cardiovascular: Normal rate, S1 normal, S2 normal, normal heart sounds and intact distal pulses.  An irregularly irregular rhythm present. Exam reveals no gallop and no friction rub.   No murmur heard. Pulmonary/Chest: Effort normal. No respiratory distress. He has decreased breath sounds in the right lower field and the left lower field. He has no wheezes. He has rales. He exhibits no tenderness.  Rales at the bases bilaterally  Abdominal: Soft. Bowel sounds are normal. He exhibits no distension. There is no tenderness.  Musculoskeletal: Normal range of motion. He exhibits no edema and no tenderness.  Lymphadenopathy:    He has no cervical adenopathy.  Neurological: He is alert and oriented to person, place, and time. Coordination normal.  Skin: Skin is warm and dry. No rash noted. No erythema.  Psychiatric: He has a normal mood and affect. His behavior is normal. Judgment and thought content normal.      Assessment and Plan

## 2013-05-22 NOTE — Telephone Encounter (Signed)
Dr. Sherene SiresWert please advise if you are okay w/ the switch? thanks

## 2013-05-22 NOTE — Assessment & Plan Note (Signed)
Currently with no symptoms of angina. No further workup at this time. Continue current medication regimen. 

## 2013-05-22 NOTE — Patient Instructions (Signed)
You are doing well. No medication changes were made.  Please call us if you have new issues that need to be addressed before your next appt.  Your physician wants you to follow-up in: 6 months.  You will receive a reminder letter in the mail two months in advance. If you don't receive a letter, please call our office to schedule the follow-up appointment.   

## 2013-05-22 NOTE — Assessment & Plan Note (Signed)
Appears relatively euvolemic on today's visit. No changes to his medications 

## 2013-05-23 NOTE — Telephone Encounter (Signed)
Pt has been scheduled with BQ on 06/19/2013 at 9:30am. Nothing further is needed.

## 2013-06-19 ENCOUNTER — Encounter: Payer: Self-pay | Admitting: Pulmonary Disease

## 2013-06-19 ENCOUNTER — Ambulatory Visit (INDEPENDENT_AMBULATORY_CARE_PROVIDER_SITE_OTHER): Payer: Medicare Other | Admitting: Pulmonary Disease

## 2013-06-19 VITALS — BP 124/56 | HR 69 | Ht 74.0 in | Wt 204.0 lb

## 2013-06-19 DIAGNOSIS — R0602 Shortness of breath: Secondary | ICD-10-CM

## 2013-06-19 DIAGNOSIS — J8410 Pulmonary fibrosis, unspecified: Secondary | ICD-10-CM

## 2013-06-19 DIAGNOSIS — I2510 Atherosclerotic heart disease of native coronary artery without angina pectoris: Secondary | ICD-10-CM

## 2013-06-19 NOTE — Assessment & Plan Note (Addendum)
Given his demographic the most likely explanation would be IPF (male ex-smoker > age 80 with CAD).  The ddx includes occupational exposure but there is nothing in his history that is strongly suggestive of this.  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 I believe this has been more closely associated with COPD than ILD.  The ddx for ILD for him would include aspiration pneumonitis (history of esophageal dilation) and less likely NSIP or chronic HP.  He does not have a history suggestive of an autoimmune disease.   Plan: -needs high rest CT chest to evaluate ILD further -lab work up after results of CT  -Full PFT -continue to monitor O2 saturation with ambulation, today he reached 88% with ambulation

## 2013-06-19 NOTE — Patient Instructions (Signed)
We will arrange a CT scan of your chest and a [facility] in the next week or two  We will see you back in 2-4 weeks or sooner if needed

## 2013-06-19 NOTE — Progress Notes (Signed)
Subjective:    Patient ID: Cody [name], male    DOB: [birth year], 80 y.o.   MRN: [id number]  HPI  Cody [name] is here to see me for shortness of breath.  He says that any minimal activity will make him short of breath. He gets to where he is gasping for air after just taking a shower.  The symptoms have been going on for more than a year and have worsened in the last few months.    He first noticed dyspnea (minor) after his heart surgery, but things really starting getting bad about two years ago and have progressed since then.  He says that he coughs up mucus on a daily basis and takes mucinex OTC for this.  He says that the mucus is typically dark grey in color and has thinned out lately.  He also coughs at night which is dry.    He denies sinus drainage.  He has acid reflux which is controlled with prilosec.  If he lies down after eating he coughs up a lot.  He never really chokes on food.  He used to have difficulty swallowing before an esophageal dilation treated with balloon dilation in 1990's.    He has been hospitalized for pneumonia several times in the last few years. The first really bad episode was in 2007 and he required hospitalization.  Since then he has been hospitalized about two or three times for pneumonia since then.  He had a CABG in 2005 by Dr. Morton Peters who said that his lungs looked "black".  Since then he had a PCI in 2009.  He had a cardiac stress test in February of this year which was read as normal.    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  He never had dyspnea then.  He previously smoked 2 packs per day as well as cigars.  He smoked for about 27 years before he quit.    Past Medical History  Diagnosis Date  . Hyperlipidemia   . Hypertension   . Coronary artery disease     a. CABG 2005. b. 2009: PTCA/atherotomy->LPDA, stent to prox-mid LCx, & PTCA of stent jailed OM1.  . Carotid artery disease     a. Prior R  CEA. b. Parent RICA s/p CEA with patch angioplasty, 40-59% LICA - f/u recommended 01/2013  . Congestive heart failure     a. EF 40% prior to CABG. b. EF 50-55% in 2012.  . Mitral regurgitation     a. Ischemic MR s/p mitral ring annuloplasty 2005 at time of CABG.  . Pulmonary fibrosis     Previous tobacco history & asphalt exposure, was told he had severe "black lungs" by cardiothoracic surgeon at time of CABG. Was supposed to have PFTs 05/2012 by pulm but missed due to bad weather.  Marland Kitchen RBBB      Family History  Problem Relation Age of Onset  . Heart attack Father   . Thyroid disease Other   . Hyperlipidemia Other   . Bone cancer Mother      History   Social History  . Marital Status: Married    Spouse Name: N/A    Number of Children: 2  . Years of Education: N/A   Occupational History  . Retired     Loss adjuster, chartered for [employer]   Social History Main Topics  . Smoking status: Former Smoker -- 1.00 packs/day for 20 years  Types: Cigarettes    Quit date: 06/12/1965  . Smokeless tobacco: Never Used  . Alcohol Use: No  . Drug Use: No  . Sexual Activity: No   Other Topics Concern  . Not on file   Social History Narrative  . No narrative on file     Allergies  Allergen Reactions  . Codeine Anxiety     Outpatient Prescriptions Prior to Visit  Medication Sig Dispense Refill  . amLODipine (NORVASC) 10 MG tablet Take 1 tablet (10 mg total) by mouth daily.  90 tablet  3  . aspirin 81 MG EC tablet Take 81 mg by mouth daily.        . carvedilol (COREG) 25 MG tablet Take 25 mg by mouth daily.       . furosemide (LASIX) 20 MG tablet Take 20 mg by mouth daily as needed.      Marland Kitchen guaiFENesin (MUCINEX) 600 MG 12 hr tablet Take 600 mg by mouth daily.       . nitroGLYCERIN (NITROSTAT) 0.4 MG SL tablet Place 1 tablet (0.4 mg total) under the tongue every 5 (five) minutes as needed for chest pain.  25 tablet  3  . Omeprazole 20 MG TBEC Take 20 mg by mouth daily. Take 30-60 min before first  meal of the day      . simvastatin (ZOCOR) 40 MG tablet Take 40 mg by mouth every evening.      . menthol-cetylpyridinium (CEPACOL) 3 MG lozenge Take 1 lozenge by mouth daily as needed for sore throat.       No facility-administered medications prior to visit.      Review of Systems  Constitutional: Negative for fever and unexpected weight change.  HENT: Positive for congestion. Negative for dental problem, ear pain, nosebleeds, postnasal drip, rhinorrhea, sinus pressure, sneezing, sore throat and trouble swallowing.   Eyes: Negative for redness and itching.  Respiratory: Positive for cough, shortness of breath and wheezing. Negative for chest tightness.   Cardiovascular: Positive for leg swelling. Negative for palpitations.  Gastrointestinal: Negative for nausea and vomiting.  Genitourinary: Negative for dysuria.  Musculoskeletal: Negative for joint swelling.  Skin: Negative for rash.  Neurological: Negative for headaches.  Hematological: Does not bruise/bleed easily.  Psychiatric/Behavioral: Negative for dysphoric mood. The patient is not nervous/anxious.        Objective:   Physical Exam Filed Vitals:   06/19/13 0912  BP: 124/56  Pulse: 69  Height: 6\' 2"  (1.88 m)  Weight: 204 lb (92.534 kg)  SpO2: 94%   RA  Ambulated 400 feet on RA and O2 saturation dropped to 88%  Gen: well appearing, no acute distress HEENT: NCAT, PERRL, EOMi, OP clear, neck supple without masses PULM: Coarse crackles bilaterally 1/2 way up CV: RRR, Slight systolic murmur 3rd IS left sternal border, no JVD AB: BS+, soft, nontender, no hsm Ext: warm, no edema, no clubbing, no cyanosis Derm: no rash or skin breakdown Neuro: A&Ox4, CN II-XII intact, strength 5/5 in all 4 extremities  05/2013 CXR reviewed > interstitial opacities bilaterally which have developed since 2007 (multiple CXR images reviewed today) 04/2013 Nuclear stress test normal 04/2013 Echo: LVEF 60-65%, grade 2 diastolic dysfunction,  mildy calcified aortic valve with trivial regurg; mitral valve s/p repair with mild stenosis; LAE, RAE     Assessment & Plan:   Postinflammatory pulmonary fibrosis Given his demographic the most likely explanation would be IPF (male ex-smoker > age 41 with CAD).  The ddx includes occupational exposure but  there is nothing in his history that is strongly suggestive of this.  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 I believe this has been more closely associated with COPD than ILD.  The ddx for ILD for him would include aspiration pneumonitis (history of esophageal dilation) and less likely NSIP or chronic HP.  He does not have a history suggestive of an autoimmune disease.   Plan: -needs high rest CT chest to evaluate ILD further -lab work up after results of CT  -Full PFT -continue to monitor O2 saturation with ambulation, today he reached 88% with ambulation   Updated Medication List Outpatient Encounter Prescriptions as of 06/19/2013  Medication Sig  . amLODipine (NORVASC) 10 MG tablet Take 1 tablet (10 mg total) by mouth daily.  Marland Kitchen. aspirin 81 MG EC tablet Take 81 mg by mouth daily.    . carvedilol (COREG) 25 MG tablet Take 25 mg by mouth daily.   . furosemide (LASIX) 20 MG tablet Take 20 mg by mouth daily as needed.  Marland Kitchen. guaiFENesin (MUCINEX) 600 MG 12 hr tablet Take 600 mg by mouth daily.   . nitroGLYCERIN (NITROSTAT) 0.4 MG SL tablet Place 1 tablet (0.4 mg total) under the tongue every 5 (five) minutes as needed for chest pain.  Marland Kitchen. Omeprazole 20 MG TBEC Take 20 mg by mouth daily. Take 30-60 min before first meal of the day  . simvastatin (ZOCOR) 40 MG tablet Take 40 mg by mouth every evening.  . [DISCONTINUED] menthol-cetylpyridinium (CEPACOL) 3 MG lozenge Take 1 lozenge by mouth daily as needed for sore throat.

## 2013-06-21 ENCOUNTER — Other Ambulatory Visit: Payer: Self-pay

## 2013-06-21 DIAGNOSIS — J9600 Acute respiratory failure, unspecified whether with hypoxia or hypercapnia: Secondary | ICD-10-CM

## 2013-06-23 ENCOUNTER — Ambulatory Visit: Payer: Self-pay | Admitting: Pulmonary Disease

## 2013-06-27 ENCOUNTER — Encounter: Payer: Self-pay | Admitting: Pulmonary Disease

## 2013-06-27 ENCOUNTER — Telehealth: Payer: Self-pay

## 2013-06-27 NOTE — Telephone Encounter (Signed)
Message copied by Velvet BatheAULFIELD, ASHLEY L on Tue Jun 27, 2013  5:17 PM ------      Message from: Lupita LeashMCQUAID, DOUGLAS B      Created: Tue Jun 27, 2013  3:54 PM       A,            Please let him know that the CT showed pulmonary fibrosis, we will discuss more in clinic.            Thanks      B ------

## 2013-06-27 NOTE — Telephone Encounter (Signed)
Relayed results of ct chest to pt and his wife.  Advised them to [facility], also verified his follow-up appt on 4/29.  Nothing further needed at this time.

## 2013-07-03 ENCOUNTER — Ambulatory Visit: Payer: Self-pay | Admitting: Pulmonary Disease

## 2013-07-11 ENCOUNTER — Other Ambulatory Visit: Payer: Self-pay | Admitting: Cardiovascular Disease

## 2013-07-12 ENCOUNTER — Encounter: Payer: Self-pay | Admitting: Pulmonary Disease

## 2013-07-12 ENCOUNTER — Telehealth: Payer: Self-pay

## 2013-07-12 ENCOUNTER — Ambulatory Visit (INDEPENDENT_AMBULATORY_CARE_PROVIDER_SITE_OTHER): Payer: Medicare Other | Admitting: Pulmonary Disease

## 2013-07-12 VITALS — BP 134/68 | HR 69 | Ht 74.0 in | Wt 210.0 lb

## 2013-07-12 DIAGNOSIS — J9611 Chronic respiratory failure with hypoxia: Secondary | ICD-10-CM

## 2013-07-12 DIAGNOSIS — J8410 Pulmonary fibrosis, unspecified: Secondary | ICD-10-CM

## 2013-07-12 DIAGNOSIS — I6529 Occlusion and stenosis of unspecified carotid artery: Secondary | ICD-10-CM

## 2013-07-12 DIAGNOSIS — R0902 Hypoxemia: Secondary | ICD-10-CM

## 2013-07-12 DIAGNOSIS — I2510 Atherosclerotic heart disease of native coronary artery without angina pectoris: Secondary | ICD-10-CM

## 2013-07-12 DIAGNOSIS — J9610 Chronic respiratory failure, unspecified whether with hypoxia or hypercapnia: Secondary | ICD-10-CM

## 2013-07-12 DIAGNOSIS — J9600 Acute respiratory failure, unspecified whether with hypoxia or hypercapnia: Secondary | ICD-10-CM

## 2013-07-12 LAB — SEDIMENTATION RATE: SED RATE: 12 mm/h (ref 0–22)

## 2013-07-12 LAB — C-REACTIVE PROTEIN: CRP: 1.1 mg/dL (ref 0.5–20.0)

## 2013-07-12 MED ORDER — PREDNISONE 10 MG PO TABS: ORAL_TABLET | ORAL | Status: DC

## 2013-07-12 NOTE — Telephone Encounter (Signed)
Dr Kendrick FriesMcQuaid, The DG esophagus you ordered for this patient, is this supposed to be a barium swallow?  I do not recall putting this order in, and [facility] is saying that they do not know what you are trying to order.  I'm happy to put in the new order if you clarify. Thanks, Morrie SheldonAshley

## 2013-07-12 NOTE — Patient Instructions (Signed)
Start taking the prednisone as prescribed We will arrange a swallowing test We will see you back in 6 weeks or sooner if needed

## 2013-07-12 NOTE — Progress Notes (Signed)
Subjective:    Patient ID: Cody [name], male    DOB: [birth year], 80 y.o.   MRN: [id number]  Synopsis: [name] for the first time in 2014 for evaluation of pulmonary fibrosis.  By 2015 his symptoms had progressed significantly.  He has never had a biopsy but a 06/2013 high resolution CT chest was felt to favor NSIP or chronic HP rather than UIP.  He has no know connective tissue disease.  Unclear if he aspirates, none by history.  Smoked 2 PPD for 27 years and quit in the 1960's.  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  HPI  07/12/2013 ROV> Unfortunately Cody [name] dyspnea has progressed since our last visit.  He has been getting short of breath with just minimal activity around the house and often has to gasp for air.  He is not using oxygen currently.  He occasionally coughs up clear mucus.  He is not coughing.  He and his wife are worried because the dyspnea has been progressing.  He has not had new chest pain or leg swelling.  Does not choke on food often.  No new joint aches or rash.  Past Medical History  Diagnosis Date  . Hyperlipidemia   . Hypertension   . Coronary artery disease     a. CABG 2005. b. 2009: PTCA/atherotomy->LPDA, stent to prox-mid LCx, & PTCA of stent jailed OM1.  . Carotid artery disease     a. Prior R CEA. b. Parent RICA s/p CEA with patch angioplasty, 47-09% LICA - f/u recommended 01/2013  . Congestive heart failure     a. EF 40% prior to CABG. b. EF 50-55% in 2012.  . Mitral regurgitation     a. Ischemic MR s/p mitral ring annuloplasty 2005 at time of CABG.  . Pulmonary fibrosis     Previous tobacco history & asphalt exposure, was told he had severe "black lungs" by cardiothoracic surgeon at time of CABG. Was supposed to have PFTs 05/2012 by pulm but missed due to bad weather.  Marland Kitchen RBBB      Review of Systems  Constitutional: Positive for fatigue.  Negative for fever and chills.  HENT: Negative for postnasal drip, rhinorrhea and sinus pressure.   Respiratory: Positive for cough and shortness of breath. Negative for wheezing.   Cardiovascular: Negative for chest pain, palpitations and leg swelling.       Objective:   Physical Exam Filed Vitals:   07/12/13 1036  BP: 134/68  Pulse: 69  Height: 6' 2" (1.88 m)  Weight: 95.255 kg (210 lb)  SpO2: 91%   RA  Ambulated 300 feet and dropped to 85% on RA, improved with 2L [place]  Gen: mild respiratory distress at rest HEENT: NCAT, EOMi, OP clear PULM: Fine crackles throughout both lungs CV: RRR, no mgr, no JVD AB: BS+, soft, nontender, no hsm Ext: warm, no edema, no clubbing, no cyanosis Derm: no rash or skin breakdown Neuro: A&Ox4, MAEW   - 05/26/2012  Walked RA x 400  stopped due to  desat to 88 then after after 100 more feet 85% 06/2013 CT (Bleitz)> Pulmonary fibrosis suggestive of HP or NSIP, unlikely UIP, borderline lymph nodes, enlarged pulmonary arteries, small left fibrothorax with rounded atelectasis in the LLL     Assessment & Plan:   Postinflammatory pulmonary fibrosis We don't have a biopsy to work off of, but the most  recent high resolution CT chest suggested Nonspecific Inflammatory Pneumonitis or Hypersensitivity Pneumonitis and did not appear consistent with UIP.  Even though UIP is the most common in his demographic, I agree with Dr. Bleitz that this radiographic pattern looks more like NSIP or chronic HP. By history, I can't identify a common environmental exposure that would cause of HP.  Both of these we would hope would respond to therapy, so I don't see a clear role for an open lung biopsy right now.  We discussed this at length today in clinic and discussed the risks and benefits of a biopsy.  Because of the reasons outlined above and the fact that he is fairly symptomatic I think we need to get him on prednisone now to see if he has a response.  They understand  that he may not respond to therapy.  They also understand that if he does have UIP, the only treatment options would be either pirfenidone or nintedamib, both of which only slow the progression of the disease.  Plan: -send ILD lab panel today: ANA, RF, CCP, ESR, CRP, Aldolase, Anti-Jo-1, SCL-70, SSA/SSB -check barium swallow to rule evaluate for aspiration -send hypersensitivity pneumonitis panel -start prednisone 60mg daily for two weeks followed by 40mg daily for a month, then 30mg daily for a month, then 20mg daily for a month -see me again in 6 weeks, if responds to treatment then start a steroid sparing agent -6 month 6MW and PFT -obtain baseline 6MW  CAROTID ARTERY STENOSIS, WITHOUT INFARCTION I think his dyspnea is due to his lung process.  Most recent cardiac work up not suggestive of active ischemia. Continue med management per Dr. Gollan  Chronic respiratory failure with hypoxia Start O2 2L with exertion and qHS Set up ONO    Updated Medication List Outpatient Encounter Prescriptions as of 07/12/2013  Medication Sig  . amLODipine (NORVASC) 10 MG tablet TAKE 1 TABLET BY MOUTH EVERY DAY  . aspirin 81 MG EC tablet Take 81 mg by mouth daily.    . carvedilol (COREG) 25 MG tablet Take 25 mg by mouth daily.   . furosemide (LASIX) 20 MG tablet Take 20 mg by mouth daily as needed.  . guaiFENesin (MUCINEX) 600 MG 12 hr tablet Take 600 mg by mouth daily.   . nitroGLYCERIN (NITROSTAT) 0.4 MG SL tablet Place 1 tablet (0.4 mg total) under the tongue every 5 (five) minutes as needed for chest pain.  . Omeprazole 20 MG TBEC Take 20 mg by mouth daily. Take 30-60 min before first meal of the day  . simvastatin (ZOCOR) 40 MG tablet Take 40 mg by mouth every evening.  . predniSONE (DELTASONE) 10 MG tablet 60mg daily for 14 days, then 40mg daily for one month, then 30mg daily for one month, then 20mg daily for one month      

## 2013-07-13 ENCOUNTER — Encounter: Payer: Self-pay | Admitting: Pulmonary Disease

## 2013-07-13 DIAGNOSIS — J9611 Chronic respiratory failure with hypoxia: Secondary | ICD-10-CM | POA: Insufficient documentation

## 2013-07-13 LAB — RHEUMATOID FACTOR: Rhuematoid fact SerPl-aCnc: <10 IU/mL (ref <=14)

## 2013-07-13 LAB — ANTI-SCLERODERMA ANTIBODY: Scleroderma (Scl-70) (ENA) Antibody, IgG: <1

## 2013-07-13 LAB — SJOGRENS SYNDROME-A EXTRACTABLE NUCLEAR ANTIBODY: SSA (Ro) (ENA) Antibody, IgG: <1

## 2013-07-13 LAB — CYCLIC CITRUL PEPTIDE ANTIBODY, IGG: Cyclic Citrullin Peptide Ab: <2 U/mL (ref 0.0–5.0)

## 2013-07-13 LAB — SJOGRENS SYNDROME-B EXTRACTABLE NUCLEAR ANTIBODY: SSB (La) (ENA) Antibody, IgG: <1

## 2013-07-13 LAB — ANA: Anti Nuclear Antibody(ANA): NEGATIVE

## 2013-07-13 NOTE — Telephone Encounter (Signed)
Spoke with Mardella LaymanLindsey, a radiology tech at [facility], and explained the test we are trying to order for this patient.  According to her, we just need to order a plain Barium Swallow.  I've placed this order, routing to BQ as an FYI.  Nothing further needed.

## 2013-07-13 NOTE — Assessment & Plan Note (Signed)
I think his dyspnea is due to his lung process.  Most recent cardiac work up not suggestive of active ischemia. Continue med management per Dr. Mariah MillingGollan

## 2013-07-13 NOTE — Telephone Encounter (Signed)
They ask this every time for some reason I want her to have an esophogram, where she swallows barium and they take pictures of her esophagus;  This is different than a modified barium swallow where a speech therapist performs the test  Can you ask them if there is a different name for this test we can order because I am not kidding when I say this is probably the 10th time in three years I have had to answer this question for them.

## 2013-07-13 NOTE — Assessment & Plan Note (Signed)
Start O2 2L with exertion and qHS Set up ONO

## 2013-07-13 NOTE — Telephone Encounter (Signed)
Ok, thanks for asking

## 2013-07-13 NOTE — Assessment & Plan Note (Addendum)
We don't have a biopsy to work off of, but the most recent high resolution CT chest suggested Nonspecific Inflammatory Pneumonitis or Hypersensitivity Pneumonitis and did not appear consistent with UIP.  Even though UIP is the most common in his demographic, I agree with Dr. Doy Mince that this radiographic pattern looks more like NSIP or chronic HP. By history, I can't identify a common environmental exposure that would cause of HP.  Both of these we would hope would respond to therapy, so I don't see a clear role for an open lung biopsy right now.  We discussed this at length today in clinic and discussed the risks and benefits of a biopsy.  Because of the reasons outlined above and the fact that he is fairly symptomatic I think we need to get him on prednisone now to see if he has a response.  They understand that he may not respond to therapy.  They also understand that if he does have UIP, the only treatment options would be either pirfenidone or nintedamib, both of which only slow the progression of the disease.  Plan: -send ILD lab panel today: ANA, RF, CCP, ESR, CRP, Aldolase, Anti-Jo-1, SCL-70, SSA/SSB -check barium swallow to rule evaluate for aspiration -send hypersensitivity pneumonitis panel -start prednisone 81m daily for two weeks followed by 422mdaily for a month, then 3066maily for a month, then 67m27mily for a month -see me again in 6 weeks, if responds to treatment then start a steroid sparing agent -6 month 6MW and PFT -obtain baseline 6MW

## 2013-07-14 LAB — ANTI-JO 1 ANTIBODY, IGG

## 2013-07-15 ENCOUNTER — Other Ambulatory Visit: Payer: Self-pay | Admitting: Cardiovascular Disease

## 2013-07-15 LAB — ALDOLASE: ALDOLASE: 6.6 U/L (ref <=8.1)

## 2013-07-17 ENCOUNTER — Ambulatory Visit: Payer: Self-pay | Admitting: Pulmonary Disease

## 2013-07-17 ENCOUNTER — Encounter: Payer: Self-pay | Admitting: Pulmonary Disease

## 2013-07-19 ENCOUNTER — Encounter: Payer: Self-pay | Admitting: Pulmonary Disease

## 2013-07-19 LAB — HYPERSENSITIVITY PNUEMONITIS PROFILE

## 2013-07-19 NOTE — Progress Notes (Signed)
Quick Note:  Spoke with pt, he is aware of results. Nothing further needed at this time. ______

## 2013-07-21 ENCOUNTER — Telehealth: Payer: Self-pay

## 2013-07-21 ENCOUNTER — Encounter: Payer: Self-pay | Admitting: Pulmonary Disease

## 2013-07-21 NOTE — Telephone Encounter (Signed)
Spoke to pt's wife Talbert ForestShirley (DPR on file) and relayed results to her.  She will let the pt know.  Nothing further needed at this time.

## 2013-07-21 NOTE — Telephone Encounter (Signed)
Message copied by Velvet BatheAULFIELD, ASHLEY L on Fri Jul 21, 2013  9:42 AM ------      Message from: Lupita LeashMCQUAID, DOUGLAS B      Created: Fri Jul 21, 2013  8:58 AM       A,            Please let him know that his barium swallow was normal            Thanks      B ------

## 2013-08-09 ENCOUNTER — Other Ambulatory Visit: Payer: Self-pay | Admitting: Cardiovascular Disease

## 2013-08-16 ENCOUNTER — Ambulatory Visit (INDEPENDENT_AMBULATORY_CARE_PROVIDER_SITE_OTHER): Payer: Medicare Other | Admitting: Pulmonary Disease

## 2013-08-16 ENCOUNTER — Encounter: Payer: Self-pay | Admitting: Pulmonary Disease

## 2013-08-16 ENCOUNTER — Telehealth: Payer: Self-pay | Admitting: Pulmonary Disease

## 2013-08-16 ENCOUNTER — Ambulatory Visit: Payer: Medicare Other | Admitting: Pulmonary Disease

## 2013-08-16 VITALS — BP 106/58 | HR 92 | Ht 74.0 in | Wt 205.0 lb

## 2013-08-16 DIAGNOSIS — I2510 Atherosclerotic heart disease of native coronary artery without angina pectoris: Secondary | ICD-10-CM

## 2013-08-16 DIAGNOSIS — J8410 Pulmonary fibrosis, unspecified: Secondary | ICD-10-CM

## 2013-08-16 DIAGNOSIS — J9610 Chronic respiratory failure, unspecified whether with hypoxia or hypercapnia: Secondary | ICD-10-CM

## 2013-08-16 DIAGNOSIS — R0902 Hypoxemia: Secondary | ICD-10-CM

## 2013-08-16 DIAGNOSIS — J9611 Chronic respiratory failure with hypoxia: Secondary | ICD-10-CM

## 2013-08-16 DIAGNOSIS — N189 Chronic kidney disease, unspecified: Secondary | ICD-10-CM

## 2013-08-16 DIAGNOSIS — Z5181 Encounter for therapeutic drug level monitoring: Secondary | ICD-10-CM

## 2013-08-16 MED ORDER — SULFAMETHOXAZOLE-TMP DS 800-160 MG PO TABS: 1.0000 | ORAL_TABLET | ORAL | Status: DC

## 2013-08-16 MED ORDER — MYCOPHENOLATE MOFETIL 500 MG PO TABS: ORAL_TABLET | ORAL | Status: DC

## 2013-08-16 NOTE — Patient Instructions (Addendum)
Start Bactrim every MWF when you get the cellcept  Start Cellcept on the following plan:  500mg  daily for a week, then  500mg  twice a day for a week, then  1000mg  in the morning and 500mg  in the evening for two weeks, then  1000mg  twice a day until you see me next  Continue taking prednisone as you are doing  We will order blood work for next week and again in three weeks  We will see you back in 6 weeks

## 2013-08-16 NOTE — Assessment & Plan Note (Addendum)
Cody [name] has done quite well with the prednisone and he says that his breathing started to improve immediately after taking it.  I believe he has a steroid responsive process such as NSIP.    Plan: -Start Bactrim every MWF   -Start Cellcept on the following plan:  -500mg  daily for a week, then  -500mg  twice a day for a week, then  -1000mg  in the morning and 500mg  in the evening for two weeks, then  -1000mg  twice a day until you see me next -Comprehensive metabolic panel in 3 weeks, and then monthly -Basic metabolic panel one week after starting Bactrim and CellCept -Followup with me in 6 weeks, I will decrease the prednisone at that point

## 2013-08-16 NOTE — Assessment & Plan Note (Addendum)
Continue 2 L of oxygen with exertion and each bedtime

## 2013-08-16 NOTE — Assessment & Plan Note (Signed)
His baseline creatinine is around 2.2. We are going to need to watch his kidney function carefully with the Bactrim.  Plan: -Repeat basic metabolic panel one week after starting Bactrim -Watch comprehensive metabolic panel

## 2013-08-16 NOTE — Progress Notes (Signed)
Subjective:    Patient ID: Cody [name], male    DOB: 01/27/1927, 80 y.o.   MRN: [id number]  Synopsis: Saw [facility] for the first time in 2014 for evaluation of pulmonary fibrosis.  By 2015 his symptoms had progressed significantly.  He has never had a biopsy but a 06/2013 high resolution CT chest was felt to favor NSIP or chronic HP rather than UIP.  He has no know connective tissue disease.  Unclear if he aspirates, none by history.  Smoked 2 PPD for 27 years and quit in the 1960's.  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  HPI  08/16/2013 ROV >> Cody [name] has done really well since the last visit. He said that nearly immediately after starting the prednisone he did feel a difference in his breathing. This is been steadily improving ever since starting prednisone. He actually he started the prednisone before the oxygen. He feels that the oxygen also it helps as well. He continues to use 2 L continuously. He has not been coughing much lately. He says that the symptom is gone away completely. He still has some mild shortness of breath but has really improved. He has not had leg swelling or chest pain. No fevers or chills.  Past Medical History  Diagnosis Date  . Hyperlipidemia   . Hypertension   . Coronary artery disease     a. CABG 2005. b. 2009: PTCA/atherotomy->LPDA, stent to prox-mid LCx, & PTCA of stent jailed OM1.  . Carotid artery disease     a. Prior R CEA. b. Parent RICA s/p CEA with patch angioplasty, 40-59% LICA - f/u recommended 01/2013  . Congestive heart failure     a. EF 40% prior to CABG. b. EF 50-55% in 2012.  . Mitral regurgitation     a. Ischemic MR s/p mitral ring annuloplasty 2005 at time of CABG.  . Pulmonary fibrosis     Previous tobacco history & asphalt exposure, was told he had severe "black lungs" by cardiothoracic surgeon at time of CABG. Was supposed to have PFTs  05/2012 by pulm but missed due to bad weather.  Marland Kitchen. RBBB      Review of Systems  Constitutional: Negative for fever, chills and fatigue.  HENT: Negative for postnasal drip, rhinorrhea and sinus pressure.   Respiratory: Negative for cough, shortness of breath and wheezing.   Cardiovascular: Negative for chest pain, palpitations and leg swelling.       Objective:   Physical Exam  Filed Vitals:   08/16/13 1043  BP: 106/58  Pulse: 92  Height: 6\' 2"  (1.88 m)  Weight: 205 lb (92.987 kg)  SpO2: 99%   2 L nasal cannula   Gen: mild respiratory distress at rest HEENT: NCAT, EOMi, OP clear PULM: Fine crackles in the bases only, good air movement upper lobes CV: RRR, no mgr, no JVD AB: BS+, soft, nontender, no hsm Ext: warm, no edema, no clubbing, no cyanosis Derm: no rash or skin breakdown Neuro: A&Ox4, MAEW   - 05/26/2012  Walked RA x 400  stopped due to  desat to 88 then after after 100 more feet 85% 06/2013 CT ([facility])> Pulmonary fibrosis suggestive of HP or NSIP, unlikely UIP, borderline lymph nodes, enlarged pulmonary arteries, small left fibrothorax with rounded atelectasis in the LLL     Assessment & Plan:   Postinflammatory pulmonary fibrosis Cody [name] has done  quite well with the prednisone and he says that his breathing started to improve immediately after taking it.  I believe he has a steroid responsive process such as NSIP.    Plan: -Start Bactrim every MWF   -Start Cellcept on the following plan:  -500mg  daily for a week, then  -500mg  twice a day for a week, then  -1000mg  in the morning and 500mg  in the evening for two weeks, then  -1000mg  twice a day until you see me next -Comprehensive metabolic panel in 3 weeks, and then monthly -Basic metabolic panel one week after starting Bactrim and CellCept -Followup with me in 6 weeks, I will decrease the prednisone at that point     Chronic respiratory failure with hypoxia Continue 2 L of oxygen with exertion  and each bedtime  Chronic kidney disease His baseline creatinine is around 2.2. We are going to need to watch his kidney function carefully with the Bactrim.  Plan: -Repeat basic metabolic panel one week after starting Bactrim -Watch comprehensive metabolic panel    Updated Medication List Outpatient Encounter Prescriptions as of 08/16/2013  Medication Sig  . amLODipine (NORVASC) 10 MG tablet TAKE 1 TABLET BY MOUTH EVERY DAY  . aspirin 81 MG EC tablet Take 81 mg by mouth daily.    . carvedilol (COREG) 25 MG tablet Take 25 mg by mouth daily.   . carvedilol (COREG) 25 MG tablet TAKE 1/2 TABLET BY MOUTH TWICE DAILY WITH A MEAL.  . furosemide (LASIX) 20 MG tablet Take 20 mg by mouth daily as needed.  Marland Kitchen guaiFENesin (MUCINEX) 600 MG 12 hr tablet Take 600 mg by mouth daily.   . nitroGLYCERIN (NITROSTAT) 0.4 MG SL tablet Place 1 tablet (0.4 mg total) under the tongue every 5 (five) minutes as needed for chest pain.  Marland Kitchen Omeprazole 20 MG TBEC Take 20 mg by mouth daily. Take 30-60 min before first meal of the day  . predniSONE (DELTASONE) 10 MG tablet 60mg  daily for 14 days, then 40mg  daily for one month, then 30mg  daily for one month, then 20mg  daily for one month...taking 40mg  qd today 08/16/13  . simvastatin (ZOCOR) 40 MG tablet Take 40 mg by mouth every evening.  . simvastatin (ZOCOR) 40 MG tablet TAKE 1 TABLET BY MOUTH EVERY EVENING  . [DISCONTINUED] predniSONE (DELTASONE) 10 MG tablet 60mg  daily for 14 days, then 40mg  daily for one month, then 30mg  daily for one month, then 20mg  daily for one month

## 2013-08-16 NOTE — Telephone Encounter (Signed)
Called the number provided and spoke with rep  She is asking for status of PA for Cellcept  I advised that med was just prescribed today, and we have not received PA information yet  She is going to fax this to the [phone] machine  Will await fax

## 2013-08-17 NOTE — Telephone Encounter (Signed)
Called spoke with [place]. Made aware no fax has been received yet. She is sending thisot triage. Will await fax.

## 2013-08-18 NOTE — Telephone Encounter (Signed)
PA form located in triage and filled out to the best of my ability [employer] @ [phone] and spoke with representative Maurine Minister to inform him that the form was indeed received Form given to [place] for BQ to complete/sign Will forward message to [place] for follow up

## 2013-08-18 NOTE — Telephone Encounter (Signed)
Laurine w/ [facility] would liek to know if we rec'd PA & can be reached at [phone].  Antionette Fairy

## 2013-08-21 NOTE — Telephone Encounter (Signed)
Please return call to [place], [phone]

## 2013-08-21 NOTE — Telephone Encounter (Signed)
Called # left below for [facility]. Was advised no Rene Kocher worked there but they did call checking on status of the cellcept form.  Morrie Sheldon please advise once done thanks

## 2013-08-22 NOTE — Telephone Encounter (Signed)
Forms have been faxed to [phone] w/ last OV note as requested for supporting documentation.  I called [phone] to speak w/ Rene Kocher. Was advised no Rene Kocher worked there. I spoke with Delice Bison we are still working on this and will put this info in system.  Will forward to [place] to await approval/denial Form placed back in BQ look at with fax stamp at bottom of page.

## 2013-08-22 NOTE — Telephone Encounter (Signed)
Regina called nurse. Please call back at [phone]. Rene Kocher works for the resolution center. However we can talk to anyone in the pharmacy.

## 2013-08-23 ENCOUNTER — Encounter: Payer: Self-pay | Admitting: Pulmonary Disease

## 2013-08-23 NOTE — Telephone Encounter (Signed)
Will watch out for this.

## 2013-08-23 NOTE — Telephone Encounter (Signed)
Alc look out for this.

## 2013-08-24 ENCOUNTER — Other Ambulatory Visit: Payer: Self-pay

## 2013-08-24 NOTE — Telephone Encounter (Signed)
Dr. Kendrick Fries, This pt's cellcept was denied because it is not an approved treatment for postinflammatory pulmonary fibrosis.  I'm filling out his appeal paperwork, can you give me a reason for appealing?  Thanks, Morrie Sheldon

## 2013-08-24 NOTE — Telephone Encounter (Signed)
Morrie Sheldon, Just keep the paperwork in the office for me to complete.  Murali, What is that paper you quote to get Cellcept approved?

## 2013-08-24 NOTE — Telephone Encounter (Signed)
PA was denied.  Appeal paperwork was faxed.  This has been filled out and will be faxed back as soon as I get a signature from BQ on this.

## 2013-08-25 NOTE — Telephone Encounter (Signed)
HEre it is below for its role in CTD-ILD  I would recommend a) that you actually quote and discuss the article and send write up; b) get generic mycophenolate as opposed to "cellcept" and c) tell them that all ILD centeres this is now standard of care esp at [facility] and [facility]      ConnectRV.com.brHttp://www.ncbi.nlm.nih.gov/pubmed/23457378   J Rheumatol. 2013 May;40(5):640-6. doi: 10.3899/jrheum.161096121043. Epub 2013 Mar 1. Mycophenolate mofetil improves lung function in connective tissue disease-associated interstitial lung disease. Fischer Johny ChessA1, Johnell ComingsBrown KK, Du IndianolaBois RM, Frankel SK, [place] GP, [place] EllentonFernandez-Perez ER, SmithvilleHuie TJ, TanglewildeKrishnamoorthy M, SecorMeehan RT, [place] BoroOlson AL, Daryel NovemberSolomon JJ, Swigris Big RockJJ. Author information Abstract OBJECTIVE: Small series suggest mycophenolate mofetil (MMF) is well tolerated and may be an effective therapy for connective tissue disease-associated interstitial lung disease (CTD-ILD). We examined the tolerability and longitudinal changes in pulmonary physiology in a large and diverse cohort of patients with CTD-ILD treated with MMF. METHODS: We identified consecutive patients evaluated at our center between January 2008 and January 2011 and prescribed MMF for CTD-ILD. We assessed safety and tolerability of MMF and used longitudinal data analyses to examine changes in pulmonary physiology over time, before and after initiation of MMF. RESULTS: We identified 125 subjects treated with MMF for a median 897 days. MMF was discontinued in 13 subjects. MMF was associated with significant improvements in estimated percentage of predicted forced vital capacity (FVC%) from MMF initiation to 52, 104, and 156 weeks (4.9%  1.9%, p = 0.01; 6.1%  1.8%, p = 0.0008; and 7.3%  2.6%, p = 0.004, respectively); and in estimated percentage predicted diffusing capacity (DLCO%) from MMF initiation to 52 and 104 weeks (6.3%  2.8%, p = 0.02; 7.1%  2.8%, p = 0.01). In the subgroup without usual interstitial pneumonia  (UIP)-pattern injury, MMF significantly improved FVC% and DLCO%, and in the subgroup with UIP-pattern injury, MMF was associated with stability in FVC% and DLCO%. CONCLUSION: In a large diverse cohort of CTD-ILD, MMF was well tolerated and had a low rate of discontinuation. Treatment with MMF was associated with either stable or improved pulmonary physiology over a median 2.5 years of followup. MMF appears to be a promising therapy for the spectrum of CTD-ILD. KEYWORDS: CONNECTIVE TISSUE DISEASE; INTERSTITIAL LUNG DISEASE; MYCOPHENOLATE MOFETIL Comment in

## 2013-09-01 NOTE — Telephone Encounter (Signed)
Letter typed by BQ has been printed and faxed to appeal Cellcept rejection.

## 2013-09-04 NOTE — Telephone Encounter (Signed)
Appeal has been faxed on Friday, waiting for word on this.  Will hold in box until we receive an answer.

## 2013-09-04 NOTE — Telephone Encounter (Signed)
Will forward to [place] to f/u on.

## 2013-09-13 NOTE — Telephone Encounter (Signed)
Morrie Sheldonshley, any word on the decision for pt's Cellcept?

## 2013-09-13 NOTE — Telephone Encounter (Signed)
Still waiting approval.  This was re-faxed yesterday.

## 2013-09-18 NOTE — Telephone Encounter (Signed)
lmomtcb [facility]

## 2013-09-18 NOTE — Telephone Encounter (Signed)
Victorino DikeJennifer [facility]/ [facility] [phone].  Would like to verify the diagnosis for this.  Need a response by 4 PM EST.  Antionette FairyHolly D Pryor

## 2013-09-20 NOTE — Telephone Encounter (Signed)
Called and lmomtcb for [facility]ennifer [facility]

## 2013-09-21 NOTE — Telephone Encounter (Signed)
Called and spoke with Victorino D[facility]Jennifer with [facility]--  She had a few other questions/dx questions that she needed answered and she will get this turned in and she will fax information back on the approval or denial of this medication. Will route back to [place] to keep an eye out for this.

## 2013-09-25 NOTE — Telephone Encounter (Signed)
This form has been filled out and faxed accordingly.  Will hold until I receive an approval.

## 2013-09-26 ENCOUNTER — Telehealth: Payer: Self-pay | Admitting: Pulmonary Disease

## 2013-09-26 ENCOUNTER — Ambulatory Visit: Payer: Medicare Other | Admitting: Pulmonary Disease

## 2013-09-26 ENCOUNTER — Ambulatory Visit (INDEPENDENT_AMBULATORY_CARE_PROVIDER_SITE_OTHER): Payer: Medicare Other | Admitting: Pulmonary Disease

## 2013-09-26 ENCOUNTER — Encounter: Payer: Self-pay | Admitting: Pulmonary Disease

## 2013-09-26 ENCOUNTER — Ambulatory Visit (INDEPENDENT_AMBULATORY_CARE_PROVIDER_SITE_OTHER)
Admission: RE | Admit: 2013-09-26 | Discharge: 2013-09-26 | Disposition: A | Discharge status: Home / Self Care | Payer: Medicare Other | Source: Ambulatory Visit | Attending: Pulmonary Disease | Admitting: Pulmonary Disease

## 2013-09-26 VITALS — BP 128/64 | HR 77 | Ht 74.0 in | Wt 212.0 lb

## 2013-09-26 DIAGNOSIS — R0989 Other specified symptoms and signs involving the circulatory and respiratory systems: Secondary | ICD-10-CM

## 2013-09-26 DIAGNOSIS — J9611 Chronic respiratory failure with hypoxia: Secondary | ICD-10-CM

## 2013-09-26 DIAGNOSIS — I2510 Atherosclerotic heart disease of native coronary artery without angina pectoris: Secondary | ICD-10-CM

## 2013-09-26 DIAGNOSIS — R0600 Dyspnea, unspecified: Secondary | ICD-10-CM

## 2013-09-26 DIAGNOSIS — R0609 Other forms of dyspnea: Secondary | ICD-10-CM

## 2013-09-26 DIAGNOSIS — J9610 Chronic respiratory failure, unspecified whether with hypoxia or hypercapnia: Secondary | ICD-10-CM

## 2013-09-26 DIAGNOSIS — R0902 Hypoxemia: Secondary | ICD-10-CM

## 2013-09-26 DIAGNOSIS — J8410 Pulmonary fibrosis, unspecified: Secondary | ICD-10-CM

## 2013-09-26 MED ORDER — PREDNISONE 10 MG PO TABS: ORAL_TABLET | ORAL | Status: AC

## 2013-09-26 NOTE — Patient Instructions (Signed)
Take prednisone 60mg  daily for two weeks then decrease to 40mg  daily until you see me next [facility] We will see you back in one month or sooner if needed

## 2013-09-26 NOTE — Assessment & Plan Note (Signed)
I am quite concerned that Mr. Cody [name]'s interstitial lung disease appears to be worsening. We will get a chest x-ray to make sure there is no evidence of something else going on like pneumonia.  His physical exam is not consistent with an alternative problem such as heart failure. He appears euvolemic today.  I am disappointed that his insurance company has not chosen to cover CellCept for this process. We will continue to appeal.  He clearly has a steroid responsive process.  Plan: -Increase prednisone to 60 mg daily for 2 weeks, then down to 40 mg daily until he sees me -Chest x-ray now -Continue appeal process for CellCept -Followup with me in 4 weeks

## 2013-09-26 NOTE — Telephone Encounter (Signed)
Discussed CXR findings of pulmonary edema which surprise me somewhat  I recommended 40mg  lasix po now and again in the morning, then he is to see Cardiology tomorrow  No other recommendations from my note today

## 2013-09-26 NOTE — Assessment & Plan Note (Signed)
Today he was quite dyspneic with exertion but his dose of oxygen did not need to be adjusted for hypoxemia. He remains on 2 L continuously.

## 2013-09-26 NOTE — Progress Notes (Signed)
Subjective:    Patient ID: Cody [name], male    DOB: [birth year], 80 y.o.   MRN: [id number]  Synopsis: Saw [facility] for the first time in 2014 for evaluation of pulmonary fibrosis.  By 2015 his symptoms had progressed significantly.  He has never had a biopsy but a 06/2013 high resolution CT chest was felt to favor NSIP or chronic HP rather than UIP.  He has no know connective tissue disease.  Unclear if he aspirates, none by history.  Smoked 2 PPD for 27 years and quit in the 1960's.  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  HPI  09/26/2012 ROV > Cody [name] said that he is weaker since the last visit.  He has been eperiencing more dyspnea lately.  He can barely move his legs due to this.  He feels like his muscles are weaker than before.  Not coughing more, he occasionally produces clear mucus.  He denies chest pain. He is occasionally swelling more in his legs, but this is rare.  He does not have fever or chills.  He is taking the prednisone and using his oxygen.    Past Medical History  Diagnosis Date  . Hyperlipidemia   . Hypertension   . Coronary artery disease     a. CABG 2005. b. 2009: PTCA/atherotomy->LPDA, stent to prox-mid LCx, & PTCA of stent jailed OM1.  . Carotid artery disease     a. Prior R CEA. b. Parent RICA s/p CEA with patch angioplasty, 40-59% LICA - f/u recommended 01/2013  . Congestive heart failure     a. EF 40% prior to CABG. b. EF 50-55% in 2012.  . Mitral regurgitation     a. Ischemic MR s/p mitral ring annuloplasty 2005 at time of CABG.  . Pulmonary fibrosis     Previous tobacco history & asphalt exposure, was told he had severe "black lungs" by cardiothoracic surgeon at time of CABG. Was supposed to have PFTs 05/2012 by pulm but missed due to bad weather.  Marland Kitchen. RBBB      Review of Systems  Constitutional: Negative for fever, chills and fatigue.  HENT: Negative for  postnasal drip, rhinorrhea and sinus pressure.   Respiratory: Positive for cough and shortness of breath. Negative for wheezing.   Cardiovascular: Negative for chest pain, palpitations and leg swelling.       Objective:   Physical Exam  Filed Vitals:   09/26/13 1508  BP: 128/64  Pulse: 77  Height: 6\' 2"  (1.88 m)  Weight: 212 lb (96.163 kg)  SpO2: 94%   2 L nasal cannula   Gen: Mildly tachypnea, speaking in full sentences HEENT: NCAT, EOMi, OP clear, moon facies PULM: Fine crackles in the bases only, good air movement upper lobes CV: RRR, no mgr, no JVD AB: BS+, soft, nontender, no hsm Ext: warm, no edema, no clubbing, no cyanosis Derm: no rash or skin breakdown Neuro: A&Ox4, MAEW   - 05/26/2012  Walked RA x 400  stopped due to  desat to 88 then after after 100 more feet 85% 06/2013 CT ([place])> Pulmonary fibrosis suggestive of HP or NSIP, unlikely UIP, borderline lymph nodes, enlarged pulmonary arteries, small left fibrothorax with rounded atelectasis in the LLL 09/26/2013 > too weak to walk more than 200 feet, did not desaturate below 93% on 2L     Assessment & Plan:   Postinflammatory pulmonary fibrosis I  am quite concerned that Cody [name] interstitial lung disease appears to be worsening. We will get a chest x-ray to make sure there is no evidence of something else going on like pneumonia.  His physical exam is not consistent with an alternative problem such as heart failure. He appears euvolemic today.  I am disappointed that his insurance company has not chosen to cover CellCept for this process. We will continue to appeal.  He clearly has a steroid responsive process.  Plan: -Increase prednisone to 60 mg daily for 2 weeks, then down to 40 mg daily until he sees me -Chest x-ray now -Continue appeal process for CellCept -Followup with me in 4 weeks  Chronic respiratory failure with hypoxia Today he was quite dyspneic with exertion but his dose of oxygen did  not need to be adjusted for hypoxemia. He remains on 2 L continuously.    Updated Medication List Outpatient Encounter Prescriptions as of 09/26/2013  Medication Sig  . amLODipine (NORVASC) 10 MG tablet TAKE 1 TABLET BY MOUTH EVERY DAY  . aspirin 81 MG EC tablet Take 81 mg by mouth daily.    . carvedilol (COREG) 25 MG tablet Take 25 mg by mouth daily.   . furosemide (LASIX) 20 MG tablet Take 20 mg by mouth daily as needed.  Marland Kitchen guaiFENesin (MUCINEX) 600 MG 12 hr tablet Take 600 mg by mouth daily.   . nitroGLYCERIN (NITROSTAT) 0.4 MG SL tablet Place 1 tablet (0.4 mg total) under the tongue every 5 (five) minutes as needed for chest pain.  Marland Kitchen Omeprazole 20 MG TBEC Take 20 mg by mouth daily. Take 30-60 min before first meal of the day  . predniSONE (DELTASONE) 10 MG tablet 60mg  daily for 14 days, then 40mg  daily for one month, then 30mg  daily for one month, then 20mg  daily for one month...taking 40mg  qd today 08/16/13  . simvastatin (ZOCOR) 40 MG tablet TAKE 1 TABLET BY MOUTH EVERY EVENING  . sulfamethoxazole-trimethoprim (BACTRIM DS) 800-160 MG per tablet Take 1 tablet by mouth 3 (three) times a week.  . mycophenolate (CELLCEPT) 500 MG tablet 500mg  qdX1 week, then 500mg  bid X1 week, then 1000mg  qam and 500mg  qpm X2 weeks, then 1000mg  bid  . [DISCONTINUED] carvedilol (COREG) 25 MG tablet TAKE 1/2 TABLET BY MOUTH TWICE DAILY WITH A MEAL.  . [DISCONTINUED] simvastatin (ZOCOR) 40 MG tablet Take 40 mg by mouth every evening.

## 2013-09-27 LAB — CBC
HCT: 43.9 % (ref 39.0–52.0)
Hemoglobin: 14.4 g/dL (ref 13.0–17.0)
MCHC: 32.9 g/dL (ref 30.0–36.0)
MCV: 88.9 fl (ref 78.0–100.0)
PLATELETS: 260 10*3/uL (ref 150.0–400.0)
RBC: 4.93 Mil/uL (ref 4.22–5.81)
RDW: 16.9 % — ABNORMAL HIGH (ref 11.5–15.5)
WBC: 11.1 10*3/uL — AB (ref 4.0–10.5)

## 2013-09-27 LAB — BASIC METABOLIC PANEL
BUN: 45 mg/dL — ABNORMAL HIGH (ref 6–23)
CHLORIDE: 103 meq/L (ref 96–112)
CO2: 29 meq/L (ref 19–32)
Calcium: 9 mg/dL (ref 8.4–10.5)
Creatinine, Ser: 2.1 mg/dL — ABNORMAL HIGH (ref 0.4–1.5)
GFR: 32.95 mL/min — AB (ref >60.00)
GLUCOSE: 218 mg/dL — AB (ref 70–99)
POTASSIUM: 5.9 meq/L — AB (ref 3.5–5.1)
SODIUM: 139 meq/L (ref 135–145)

## 2013-09-27 NOTE — Addendum Note (Signed)
Addended by: Velvet BatheAULFIELD, Jaymes Revels L on: 09/27/2013 03:58 PM   Modules accepted: Orders

## 2013-09-27 NOTE — Progress Notes (Signed)
Quick Note:  Pt aware of results and recs. Bmet ordered. Nothing further needed at this time. ______

## 2013-09-29 ENCOUNTER — Other Ambulatory Visit (INDEPENDENT_AMBULATORY_CARE_PROVIDER_SITE_OTHER): Payer: Medicare Other

## 2013-09-29 DIAGNOSIS — Z5181 Encounter for therapeutic drug level monitoring: Secondary | ICD-10-CM

## 2013-09-29 DIAGNOSIS — J8410 Pulmonary fibrosis, unspecified: Secondary | ICD-10-CM

## 2013-09-29 DIAGNOSIS — N183 Chronic kidney disease, stage 3 unspecified: Secondary | ICD-10-CM

## 2013-09-29 DIAGNOSIS — N184 Chronic kidney disease, stage 4 (severe): Secondary | ICD-10-CM

## 2013-09-29 LAB — CBC
HEMATOCRIT: 44.1 % (ref 39.0–52.0)
HEMOGLOBIN: 14.6 g/dL (ref 13.0–17.0)
MCHC: 33.2 g/dL (ref 30.0–36.0)
MCV: 88.5 fl (ref 78.0–100.0)
Platelets: 272 10*3/uL (ref 150.0–400.0)
RBC: 4.98 Mil/uL (ref 4.22–5.81)
RDW: 16.8 % — ABNORMAL HIGH (ref 11.5–15.5)
WBC: 16.6 10*3/uL — AB (ref 4.0–10.5)

## 2013-09-29 LAB — COMPREHENSIVE METABOLIC PANEL
ALT: 26 U/L (ref 0–53)
AST: 19 U/L (ref 0–37)
Albumin: 2.9 g/dL — ABNORMAL LOW (ref 3.5–5.2)
Alkaline Phosphatase: 53 U/L (ref 39–117)
BUN: 62 mg/dL — AB (ref 6–23)
CO2: 27 meq/L (ref 19–32)
CREATININE: 2.2 mg/dL — AB (ref 0.4–1.5)
Calcium: 8.9 mg/dL (ref 8.4–10.5)
Chloride: 103 mEq/L (ref 96–112)
GFR: 31.18 mL/min — AB (ref >60.00)
Glucose, Bld: 152 mg/dL — ABNORMAL HIGH (ref 70–99)
Potassium: 5.5 mEq/L — ABNORMAL HIGH (ref 3.5–5.1)
Sodium: 141 mEq/L (ref 135–145)
Total Bilirubin: 0.6 mg/dL (ref 0.2–1.2)
Total Protein: 5.6 g/dL — ABNORMAL LOW (ref 6.0–8.3)

## 2013-09-29 LAB — BASIC METABOLIC PANEL
BUN: 62 mg/dL — ABNORMAL HIGH (ref 6–23)
CHLORIDE: 103 meq/L (ref 96–112)
CO2: 27 meq/L (ref 19–32)
Calcium: 8.9 mg/dL (ref 8.4–10.5)
Creatinine, Ser: 2.2 mg/dL — ABNORMAL HIGH (ref 0.4–1.5)
GFR: 31.18 mL/min — ABNORMAL LOW (ref >60.00)
Glucose, Bld: 152 mg/dL — ABNORMAL HIGH (ref 70–99)
POTASSIUM: 5.5 meq/L — AB (ref 3.5–5.1)
Sodium: 141 mEq/L (ref 135–145)

## 2013-09-29 NOTE — Telephone Encounter (Signed)
I have not received anything new on this.  Called and [name] [phone] ext. [phone] to follow up on this.

## 2013-09-29 NOTE — Telephone Encounter (Signed)
Please advise if anything has been received? thanks 

## 2013-10-03 NOTE — Telephone Encounter (Signed)
Called jennifer and LMTCB x2

## 2013-10-04 ENCOUNTER — Encounter: Payer: Self-pay | Admitting: Cardiovascular Disease

## 2013-10-04 ENCOUNTER — Ambulatory Visit (INDEPENDENT_AMBULATORY_CARE_PROVIDER_SITE_OTHER): Payer: Medicare Other | Admitting: Cardiovascular Disease

## 2013-10-04 VITALS — BP 140/60 | HR 96 | Ht 74.0 in | Wt 212.5 lb

## 2013-10-04 DIAGNOSIS — N184 Chronic kidney disease, stage 4 (severe): Secondary | ICD-10-CM

## 2013-10-04 DIAGNOSIS — I5033 Acute on chronic diastolic (congestive) heart failure: Secondary | ICD-10-CM

## 2013-10-04 DIAGNOSIS — I2510 Atherosclerotic heart disease of native coronary artery without angina pectoris: Secondary | ICD-10-CM

## 2013-10-04 DIAGNOSIS — J9610 Chronic respiratory failure, unspecified whether with hypoxia or hypercapnia: Secondary | ICD-10-CM

## 2013-10-04 DIAGNOSIS — J9611 Chronic respiratory failure with hypoxia: Secondary | ICD-10-CM

## 2013-10-04 DIAGNOSIS — I4892 Unspecified atrial flutter: Secondary | ICD-10-CM

## 2013-10-04 DIAGNOSIS — I509 Heart failure, unspecified: Secondary | ICD-10-CM

## 2013-10-04 DIAGNOSIS — R0602 Shortness of breath: Secondary | ICD-10-CM

## 2013-10-04 DIAGNOSIS — R0902 Hypoxemia: Secondary | ICD-10-CM

## 2013-10-04 NOTE — Patient Instructions (Signed)
Take extra lasix daily  Goal weight is 200 pounds, Try to get the weight down three pounds  Please call us if you have new issues that need to be addressed before your next appt.  Your physician wants you to follow-up in: 1 month.

## 2013-10-04 NOTE — Progress Notes (Signed)
Quick Note:  LMTCB X1 to relay results and recs. Will call in medication after speaking to pt. ______

## 2013-10-04 NOTE — Progress Notes (Signed)
Patient ID: Cody [name], male    DOB: 1926/05/15, 80 y.o.   MRN: [id number]  HPI Comments: 80 year old male with a history of coronary artery disease, bypass surgery in 2005, peripheral vascular disease with right carotid endarterectomy for 80% lesion, PCI of his left circumflex in 2009 and PTCA of an OM1 at the same time, history of hypertension, hyperlipidemia who presents for followup. He has severe COPD, followed by pulmonary  long history of exposure to asphalt, previous smoking history (smoked for 25 years,stopped in 1967).  daughter  passed away from brain cancer.he was told by cardiothoracic surgeon at the time of his bypass that he had severe "black lungs". pneumonia in December 2012 and in November 2013.  chronic shortness of breath  In followup today, he reports everything has been severe. He did a course of prednisone, breathing worse now back on prednisone. He is taking 60 mg daily. Weight is up 5 pounds from his prior clinic visit. He has complaints of severe leg weakness. Wife reports that he is not doing anything. Is taking Lasix 15-20 pills per week, every other day, sometimes every day. Weight is around 203 pounds at home. Recent chest x-ray suggesting pulmonary edema, very small pleural effusions  Prior cardiac workup with stress test and echocardiogram for shortness of breath symptoms in February 2015 showing normal LV function, no ischemia  admitted to the hospital the day after Christmas 2014 with shortness of breath.  treated with antibiotics, prednisone, diuretics. He had worsening renal function with a creatinine 2.6. Lasix was held. EKG showed atrial flutter which was new. Rate was relatively well-controlled. He was not started on anticoagulation.  In follow up in clinic, we started him on anticoagulation.  Cardiac cath November 28 2007 showed that the LIMA to the LAD was patent.  The saphenous vein graft to the diagonal was patent.  The right coronary artery was  nondominant.  The circumflex had a 90% stenosis of the takeoff of OM1.  The takeoff of the OM2 had a subtotal occlusion. The jump graft between the OM2 and distal circumflex were patent.  He underwent successful PTCA and stenting of the circumflex and a PTCA of the OM1.    Carotid ultrasound in November 2011 showing 40-59% carotid disease on the left    EKG  shows normal sinus rhythm with rate 96 beats per minute, right bundle branch block    Outpatient Encounter Prescriptions as of 10/04/2013  Medication Sig  . amLODipine (NORVASC) 10 MG tablet TAKE 1 TABLET BY MOUTH EVERY DAY  . aspirin 81 MG EC tablet Take 81 mg by mouth daily.    . carvedilol (COREG) 25 MG tablet Take 25 mg by mouth daily.   . furosemide (LASIX) 20 MG tablet Take 20 mg by mouth daily as needed.  Marland Kitchen. guaiFENesin (MUCINEX) 600 MG 12 hr tablet Take 600 mg by mouth daily.   . nitroGLYCERIN (NITROSTAT) 0.4 MG SL tablet Place 1 tablet (0.4 mg total) under the tongue every 5 (five) minutes as needed for chest pain.  Marland Kitchen. Omeprazole 20 MG TBEC Take 20 mg by mouth daily. Take 30-60 min before first meal of the day  . predniSONE (DELTASONE) 10 MG tablet Take 60mg  daily for 14 days, then take 40mg  daily until you see me next  . simvastatin (ZOCOR) 40 MG tablet TAKE 1 TABLET BY MOUTH EVERY EVENING   Review of Systems  HENT: Negative.   Eyes: Negative.   Respiratory: Positive for shortness of breath.  Chronic  Cardiovascular: Negative.   Gastrointestinal: Negative.   Endocrine: Negative.   Musculoskeletal: Negative.   Skin: Negative.   Allergic/Immunologic: Negative.   Neurological: Positive for weakness.  Hematological: Negative.   Psychiatric/Behavioral: Negative.   All other systems reviewed and are negative.  BP 140/60  Pulse 96  Ht 6\' 2"  (1.88 m)  Wt 212 lb 8 oz (96.389 kg)  BMI 27.27 kg/m2  Physical Exam  Nursing note and vitals reviewed. Constitutional: He is oriented to person, place, and time. He appears  well-developed and well-nourished.  HENT:  Head: Normocephalic.  Nose: Nose normal.  Mouth/Throat: Oropharynx is clear and moist.  Eyes: Conjunctivae are normal. Pupils are equal, round, and reactive to light.  Neck: Normal range of motion. Neck supple. No JVD present.  Cardiovascular: Normal rate, S1 normal, S2 normal, normal heart sounds and intact distal pulses.  An irregularly irregular rhythm present. Exam reveals no gallop and no friction rub.   No murmur heard. Pulmonary/Chest: Effort normal. No respiratory distress. He has decreased breath sounds in the right lower field and the left lower field. He has no wheezes. He has rales. He exhibits no tenderness.  Rales at the bases bilaterally  Abdominal: Soft. Bowel sounds are normal. He exhibits no distension. There is no tenderness.  Musculoskeletal: Normal range of motion. He exhibits no edema and no tenderness.  Lymphadenopathy:    He has no cervical adenopathy.  Neurological: He is alert and oriented to person, place, and time. Coordination normal.  Skin: Skin is warm and dry. No rash noted. No erythema.  Psychiatric: He has a normal mood and affect. His behavior is normal. Judgment and thought content normal.      Assessment and Plan

## 2013-10-04 NOTE — Telephone Encounter (Signed)
As of this morning I have yet to receive any new paperwork on this patient, or a call back from [place]ennifer to see how to proceed with this appeal.

## 2013-10-04 NOTE — Assessment & Plan Note (Signed)
Moderate shortness of breath on today's visit at rest on oxygen. He is on high-dose prednisone. Recommended more aggressive diuresis at the risk of hurting his kidneys. Suggested he hold Lasix for weight less than 200 pounds. Currently at home 203 pounds

## 2013-10-04 NOTE — Telephone Encounter (Signed)
called jennifer and LMTCB x3 Morrie Sheldonshley have you received anything yet? thanks

## 2013-10-04 NOTE — Assessment & Plan Note (Signed)
Underlying renal dysfunction will make it difficult for cardiac workup. Not a good candidate for cardiac catheterization given creatinine of 2

## 2013-10-04 NOTE — Assessment & Plan Note (Signed)
Currently with no symptoms of angina. No further workup at this time. Continue current medication regimen. 

## 2013-10-04 NOTE — Assessment & Plan Note (Signed)
Recommended that he start taking Lasix daily. Goal weight 200 pounds rather than 203 pounds at home. We will need to risk mild dehydration for shortness of breath symptoms. He is very symptomatic on today's visit. No clear signs of heart failure, no edema, no dramatic weight gain or abdominal swelling. Recent ischemia workup was normal

## 2013-10-05 ENCOUNTER — Telehealth: Payer: Self-pay | Admitting: Pulmonary Disease

## 2013-10-05 NOTE — Telephone Encounter (Signed)
LMTCB X4 for [employer]ennifer

## 2013-10-05 NOTE — Telephone Encounter (Signed)
I spoke with the pt and notified of recs per BQ  He verbalized understanding  He agrees to call PCP for appt to discuss to potassium issue  He states that his ins will not cover the Atovaquone and therefore does not want to take this med  He states that he is [age] and "just waiting for the lord to take me" and does not want to be worried with dealing with expensive meds and ins companies  He states that he will be happy to take any med that his ins will cover  Please advise thanks

## 2013-10-05 NOTE — Telephone Encounter (Signed)
Ok Tell him that he should just take the prednisone I prescribed and if his insurance covers the cellcept after our appeal we will deal with the atovaquone then

## 2013-10-05 NOTE — Telephone Encounter (Signed)
Ok, then he will need to take Atovaquone 1500mg  po daily when he starts the cellcept The potassium issue needs to be addressed, ask him to schedule an appointment with his PCP; alternatively we can put in a consult to nephrology.

## 2013-10-05 NOTE — Telephone Encounter (Signed)
A, Please let him know that his potassium remains high and the bactrim is likely contributing to this. So I want him to stop the bactrim altogether and start taking atovaquone 1500mg  daily instead. Thanks ---   Called spoke w/ spouse. She reports pt never started the bactrim and is not currently on this. Please advise Dr. Kendrick FriesMcQuaid thanks

## 2013-10-05 NOTE — Telephone Encounter (Signed)
Called made pt aware of recs. Nothing further needed 

## 2013-10-09 NOTE — Telephone Encounter (Signed)
lmomtcb x 5 [facility]

## 2013-10-13 NOTE — Telephone Encounter (Signed)
lmtcb X6 for Victorino DikeJennifer, also lmtcb on general voicemail option.

## 2013-10-18 NOTE — Telephone Encounter (Signed)
lmtcb X7 for DIRECTVJennifer.

## 2013-10-19 MED ORDER — MYCOPHENOLATE MOFETIL 500 MG PO TABS: ORAL_TABLET | ORAL | Status: AC

## 2013-10-19 NOTE — Telephone Encounter (Signed)
Sp[facility] with Victor[facility]Jennifer [facility], Cellcept has been approved through 03/15/14.  Confirmation code [id number].  [facility] is [facility].    While on the phone with Mrs Bethann GooJefferson to let her know that the medication had been approved, she mentioned how poorly Mr. Bethann GooJefferson has been doing.  States he is having increased SOB with any exertion, is limited to a walker now, having difficulty with daily tasks such as bathing.  His next appt is on 9/15.  They are concerned that he is so poor that the medication may not help him improve.  Pt's wife was talking about how sometimes the patient feels like he is gasping for air.    Do you have any suggestions for him at this point?   Thanks

## 2013-10-19 NOTE — Telephone Encounter (Signed)
[facility], medication has been approved through 03/15/2014.  Confirmation code [id number].  Cellcept has been recent to

## 2013-10-19 NOTE — Addendum Note (Signed)
Addended by: Velvet BatheAULFIELD, ASHLEY L on: 10/19/2013 05:43 PM   Modules accepted: Orders

## 2013-10-20 NOTE — Telephone Encounter (Signed)
He needs to see me or Tammy sooner than that.  [place]

## 2013-10-20 NOTE — Telephone Encounter (Signed)
Pt scheduled for appt with TP 10/25/13 at 945 in [place] Pt aware of location of our office.  Nothing further needed.

## 2013-10-25 ENCOUNTER — Ambulatory Visit (INDEPENDENT_AMBULATORY_CARE_PROVIDER_SITE_OTHER): Payer: Medicare Other | Admitting: Adult Health

## 2013-10-25 ENCOUNTER — Encounter: Payer: Self-pay | Admitting: Adult Health

## 2013-10-25 VITALS — BP 114/66 | HR 74 | Temp 97.4°F | Ht 74.0 in | Wt 205.6 lb

## 2013-10-25 DIAGNOSIS — R0602 Shortness of breath: Secondary | ICD-10-CM

## 2013-10-25 DIAGNOSIS — I2510 Atherosclerotic heart disease of native coronary artery without angina pectoris: Secondary | ICD-10-CM

## 2013-10-25 DIAGNOSIS — J8410 Pulmonary fibrosis, unspecified: Secondary | ICD-10-CM

## 2013-10-25 NOTE — Patient Instructions (Addendum)
Decrease Prednisone 30mg  daily for 1  Week then 20mg  daily for 1 week. -hold at this dose until seen back  DME home evaluation for assistance sent along w/ orders for hospital bed, wheelchair .  Increase Oxygen 3l/m at rest and 6l/m with activity  Follow up with cardiology next week as planned Follow up Dr. Kendrick FriesMcQuaid in 2 weeks and As needed  [facility]([place] office )  Please contact office for sooner follow up if symptoms do not improve or worsen or seek emergency care

## 2013-10-25 NOTE — Assessment & Plan Note (Signed)
Suspect progression of Pulmonary Fibrosis with increased O2 demands.  No significant improvement with steroid burst ? Steroid myopathy /weakness >will taper steroids down to 10 and hold  For now cellcept on hold , will discuss on return with Dr. Kendrick FriesMcquaid. -has not had tissue dx.  Does not appear fluid overloaded -cont current diuretic dose per cards.  Encouraged follow up with cards regarding persistent chest tightness as hx of CAD , recent stress test 04/2013 w/ no ischemia noted.  Will adjust O2 demands to keep sats >90%  Needs home assessement w/ PT and possible nurse aides.  Needs wheelchair and hospital bed   Plan  Decrease Prednisone 30mg  daily for 1  Week then 20mg  daily for 1 week. -hold at this dose until seen back  DME home evaluation for assistance sent along w/ orders for hospital bed, wheelchair .  Increase Oxygen 3l/m at rest and 6l/m with activity  Follow up with cardiology next week as planned Follow up Dr. Kendrick FriesMcQuaid in 2 weeks and As needed   Please contact office for sooner follow up if symptoms do not improve or worsen or seek emergency care

## 2013-10-25 NOTE — Progress Notes (Signed)
Subjective:    Patient ID: Cody [name], male    DOB: 1926/12/04, 80 y.o.   MRN: [id number]  HPI Synopsis: Saw [facility] for the first time in 2014 for evaluation of pulmonary fibrosis.  By 2015 his symptoms had progressed significantly.  He has never had a biopsy but a 06/2013 high resolution CT chest was felt to favor NSIP or chronic HP rather than UIP.  He has no know connective tissue disease.  Unclear if he aspirates, none by history.  Smoked 2 PPD for 27 years and quit in the 1960's.  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  10/25/2013 Acute OV  Complains of worsening DOE. Says he  Seen 4 weeks ago with similar symptoms. CXR showed mild bibasilar airspace opacities and small pleural effusion with interstitial prominence. Was started on prednisone burst w/ 60mg  daily for 2 weeks then 40mg  daily .  Says no change in breathing at all. Can barely do anything for himself now. Wife has to help with feeding himself as he wears out , legs feel like rubber, can only walk few steps before gives out. At rest sitting still feels ok.  Started on lasix by cards for suspected Acute on chronic Diastolic dysfunction. Helped with leg swelling but did not change breathing.  Was started on Cellcept , had to await for prior authortization from insurance -took ~4 weeks. Got this couple of days ago but has decided not to take because he is scared of it. We reveiwed all the risks and benefits however he declines at this time.  In office sats >90% on 3 ll/m , w/ walking in exam room which was very difficult for pt , sats drop in low 80% , took 6l/m to keep sats >90%.  Is following w/ cardiology w/ previous stress test neg for ischemia early this year. He continues to get chest tightness with walking/activity.  Wife is having trouble taking care of him at home. He has trouble getting up from sitting position.      Review of Systems Constitutional:   No  weight loss, night sweats,  Fevers, chills,  +fatigue, or  lassitude.  HEENT:   No headaches,  Difficulty swallowing,  Tooth/dental problems, or  Sore throat,                No sneezing, itching, ear ache, nasal congestion, post nasal drip,   CV:  No chest pain,  Orthopnea, PND, +swelling in lower extremities,  No anasarca, dizziness, palpitations, syncope.   GI  No heartburn, indigestion, abdominal pain, nausea, vomiting, diarrhea, change in bowel habits, loss of appetite, bloody stools.   Resp:   No chest wall deformity  Skin: no rash or lesions.  GU: no dysuria, change in color of urine, no urgency or frequency.  No flank pain, no hematuria   MS:  No joint pain or swelling.  No decreased range of motion.  No back pain.  Psych:  No change in mood or affect. No depression or anxiety.  No memory loss.         Objective:   Physical Exam GEN: A/Ox3; pleasant , NAD, chronically ill appearing on O2   HEENT:  [place]/AT,  EACs-clear, TMs-wnl, NOSE-clear, THROAT-clear, no lesions, no postnasal drip or exudate noted.   NECK:  Supple w/ fair ROM; no JVD; normal carotid impulses w/o bruits; no thyromegaly or nodules palpated;  no lymphadenopathy.  RESP  Bibasilar crackles no accessory muscle use, no dullness to percussion  CARD:  RRR, no m/r/g  , tr peripheral edema, pulses intact, no cyanosis or clubbing.  GI:   Soft & nt; nml bowel sounds; no organomegaly or masses detected.  Musco: Warm bil, no deformities or joint swelling noted.  Diffuse muscle weakness.   Neuro: alert, no focal deficits noted.    Skin: Warm, no lesions or rashes         Assessment & Plan:

## 2013-10-26 ENCOUNTER — Telehealth: Payer: Self-pay

## 2013-10-26 NOTE — Telephone Encounter (Signed)
Spoke with Rick-with PT  He states that when he went to evaluate the pt, he refused services  He mentioned wanting Hospice instead  Pt told him that he did not have the energy to try rehab  [facility] make him aware

## 2013-10-26 NOTE — Telephone Encounter (Signed)
Dr. Kendrick FriesMcQuaid, Tammy saw Mr. Bethann GooJefferson yesterday, see her note for details on that visit.  She is asking that you see him in [place] in 2 weeks because of how poorly he is doing.  You're in [place] on the 25th and the 27th, 100% booked, as well as your next available date on 9/3.  Is it ok to overbook him, or would you like to see if they'd be willing to see Mungal?  Thanks, [employer]

## 2013-10-26 NOTE — Telephone Encounter (Signed)
Wife OK with hospice, I discussed with her by phone tonight Please send order for Hospice Also want prescription for something like a Hoveround OK to overbook with me my next day in [place]

## 2013-10-26 NOTE — Telephone Encounter (Signed)
Family requesting hospice This is very reasonable considering his rapid decline Will set up

## 2013-10-26 NOTE — Telephone Encounter (Signed)
I think that this is entirely appropriate Will ask the office to set this up tomorrow Will try to call him tonight to discuss

## 2013-10-26 NOTE — Telephone Encounter (Signed)
Call from ric saying that pt is refusing rehab and they want pursue hospice or home aid services he can be reached @ [phone].[name]

## 2013-11-06 ENCOUNTER — Telehealth: Payer: Self-pay | Admitting: Pulmonary Disease

## 2013-11-06 DIAGNOSIS — J849 Interstitial pulmonary disease, unspecified: Secondary | ICD-10-CM

## 2013-11-06 NOTE — Telephone Encounter (Signed)
Hospice referral placed. Order placed to see about DME providing pt with hoveround. Spoke with [facility].  She is aware of above.

## 2013-11-06 NOTE — Telephone Encounter (Signed)
I thought this was already done last week  Triage, please arrange hospice now for interstitial lung disease

## 2013-11-06 NOTE — Telephone Encounter (Signed)
BQ please advise on the referral to hospice and the hover- round for the pt.  thanks

## 2013-11-07 ENCOUNTER — Telehealth: Payer: Self-pay | Admitting: Pulmonary Disease

## 2013-11-07 DIAGNOSIS — J849 Interstitial pulmonary disease, unspecified: Secondary | ICD-10-CM

## 2013-11-07 NOTE — Telephone Encounter (Signed)
New hospice order placed to be sent to [facility].  Nothing further is needed

## 2013-11-08 ENCOUNTER — Ambulatory Visit: Payer: Medicare Other | Admitting: Cardiovascular Disease

## 2013-11-09 ENCOUNTER — Telehealth: Payer: Self-pay | Admitting: Pulmonary Disease

## 2013-11-09 MED ORDER — MORPHINE SULFATE 20 MG/5ML PO SOLN: ORAL | Status: AC

## 2013-11-09 MED ORDER — LORAZEPAM 0.5 MG PO TABS: ORAL_TABLET | ORAL | Status: AC

## 2013-11-10 NOTE — Telephone Encounter (Signed)
Denise from hospice called. This pt expired this morning. They still need this from, and they need it today. Please call her back with any questions [phone]. You can also fax the form to [phone]

## 2013-11-10 NOTE — Telephone Encounter (Signed)
No will go through my stuff and see if I can find it today

## 2013-11-10 NOTE — Telephone Encounter (Signed)
Dr. Kendrick Fries, have you filled out this form yet?    Thanks!

## 2013-11-14 NOTE — Telephone Encounter (Signed)
Spoke with Sue Lush with Hospice.  She is in pt;s home now.  C/o very sob, sats 86% on 6L. 30 respirations/ minute.  Decreased airflow.  Pt doesn't not want to go to ER.  They do not have bed for him at Hospice home at this time.  They are requesting order for Roxinol /  1/2 - 1 cc every 1 hr prn and lorazepam 0.5 - 1 mg every 4 hrs prn.  Fax to [facility] in [place].  Please advise asap.

## 2013-11-14 NOTE — Telephone Encounter (Signed)
OK by me for this

## 2013-11-14 NOTE — Telephone Encounter (Signed)
BQ called the office and also provided verbal authorization for both the Roxinol and the Lorazepam BQ not in the office this morning - will ask doc of day to sign rx's (CDY)  Per CDY, okay to print and will sign rx's Called pt's Hospice nurse Sue Lush and advised her of the above.  She did clarify that the Roxinol is /71mL.  Rx to match this.  Rx's printed, signed by CDY and faxed to [facility] [address] in [place]. Nothing further needed at this time; will sign off.

## 2013-11-14 NOTE — Telephone Encounter (Signed)
Cody [name] states she has received this form and will have BQ address this at office this afternoon.  Spoke with [facility] with Hospice and informed her of this.  Will forward to [place] to sign off on after faxing form.

## 2013-11-14 DEATH — deceased

## 2013-11-15 NOTE — Telephone Encounter (Signed)
Morrie Sheldon, do you know status of this?

## 2013-11-15 NOTE — Telephone Encounter (Signed)
Spoke with Angelique Blonder at [facility], this has been received.  Nothing further needed.

## 2013-11-21 ENCOUNTER — Ambulatory Visit: Payer: Medicare Other | Admitting: Internal Medicine

## 2013-11-28 ENCOUNTER — Ambulatory Visit: Payer: Medicare Other | Admitting: Pulmonary Disease
